# Patient Record
Sex: Female | Born: 1955 | Race: White | Hispanic: No | Marital: Married | Smoking: Never smoker
Health system: Southern US, Community
[De-identification: ages and names within clinical notes are randomized; demographics above are authoritative.]

## PROBLEM LIST (undated history)

## (undated) DIAGNOSIS — K219 Gastro-esophageal reflux disease without esophagitis: Secondary | ICD-10-CM

## (undated) DIAGNOSIS — M1990 Unspecified osteoarthritis, unspecified site: Secondary | ICD-10-CM

## (undated) DIAGNOSIS — E039 Hypothyroidism, unspecified: Secondary | ICD-10-CM

## (undated) DIAGNOSIS — R0602 Shortness of breath: Secondary | ICD-10-CM

## (undated) DIAGNOSIS — E119 Type 2 diabetes mellitus without complications: Secondary | ICD-10-CM

## (undated) DIAGNOSIS — I10 Essential (primary) hypertension: Secondary | ICD-10-CM

## (undated) HISTORY — PX: WISDOM TOOTH EXTRACTION: SHX21

---

## 2005-09-22 ENCOUNTER — Encounter: Admission: RE | Admit: 2005-09-22 | Discharge: 2005-12-21 | Payer: Self-pay | Admitting: Surgery

## 2013-12-09 ENCOUNTER — Other Ambulatory Visit: Payer: Self-pay | Admitting: Orthopaedic Surgery

## 2013-12-21 ENCOUNTER — Other Ambulatory Visit (HOSPITAL_COMMUNITY): Payer: Self-pay | Admitting: *Deleted

## 2013-12-21 ENCOUNTER — Encounter (HOSPITAL_COMMUNITY): Payer: Self-pay

## 2013-12-21 ENCOUNTER — Encounter (HOSPITAL_COMMUNITY)
Admission: RE | Admit: 2013-12-21 | Discharge: 2013-12-21 | Disposition: A | Discharge status: Home / Self Care | Payer: 59 | Source: Ambulatory Visit | Attending: Orthopaedic Surgery | Admitting: Orthopaedic Surgery

## 2013-12-21 ENCOUNTER — Ambulatory Visit (HOSPITAL_COMMUNITY)
Admission: RE | Admit: 2013-12-21 | Discharge: 2013-12-21 | Disposition: A | Discharge status: Home / Self Care | Payer: 59 | Source: Ambulatory Visit | Attending: Orthopaedic Surgery | Admitting: Orthopaedic Surgery

## 2013-12-21 DIAGNOSIS — I10 Essential (primary) hypertension: Secondary | ICD-10-CM | POA: Insufficient documentation

## 2013-12-21 DIAGNOSIS — Z01818 Encounter for other preprocedural examination: Secondary | ICD-10-CM | POA: Insufficient documentation

## 2013-12-21 DIAGNOSIS — E119 Type 2 diabetes mellitus without complications: Secondary | ICD-10-CM | POA: Insufficient documentation

## 2013-12-21 DIAGNOSIS — E039 Hypothyroidism, unspecified: Secondary | ICD-10-CM | POA: Insufficient documentation

## 2013-12-21 DIAGNOSIS — K219 Gastro-esophageal reflux disease without esophagitis: Secondary | ICD-10-CM | POA: Insufficient documentation

## 2013-12-21 HISTORY — DX: Type 2 diabetes mellitus without complications: E11.9

## 2013-12-21 HISTORY — DX: Essential (primary) hypertension: I10

## 2013-12-21 HISTORY — DX: Gastro-esophageal reflux disease without esophagitis: K21.9

## 2013-12-21 HISTORY — DX: Unspecified osteoarthritis, unspecified site: M19.90

## 2013-12-21 HISTORY — DX: Shortness of breath: R06.02

## 2013-12-21 HISTORY — DX: Hypothyroidism, unspecified: E03.9

## 2013-12-21 LAB — CBC WITH DIFFERENTIAL/PLATELET
BASOS ABS: 0.1 10*3/uL (ref 0.0–0.1)
Basophils Relative: 1 % (ref 0–1)
Eosinophils Absolute: 0.2 10*3/uL (ref 0.0–0.7)
Eosinophils Relative: 2 % (ref 0–5)
HEMATOCRIT: 45.3 % (ref 36.0–46.0)
Hemoglobin: 15.5 g/dL — ABNORMAL HIGH (ref 12.0–15.0)
LYMPHS ABS: 4.2 10*3/uL — AB (ref 0.7–4.0)
LYMPHS PCT: 39 % (ref 12–46)
MCH: 30 pg (ref 26.0–34.0)
MCHC: 34.2 g/dL (ref 30.0–36.0)
MCV: 87.8 fL (ref 78.0–100.0)
Monocytes Absolute: 0.7 10*3/uL (ref 0.1–1.0)
Monocytes Relative: 6 % (ref 3–12)
Neutro Abs: 5.6 10*3/uL (ref 1.7–7.7)
Neutrophils Relative %: 52 % (ref 43–77)
PLATELETS: 321 10*3/uL (ref 150–400)
RBC: 5.16 MIL/uL — ABNORMAL HIGH (ref 3.87–5.11)
RDW: 13 % (ref 11.5–15.5)
WBC: 10.7 10*3/uL — AB (ref 4.0–10.5)

## 2013-12-21 LAB — URINALYSIS, ROUTINE W REFLEX MICROSCOPIC
Glucose, UA: NEGATIVE mg/dL
HGB URINE DIPSTICK: NEGATIVE
KETONES UR: NEGATIVE mg/dL
NITRITE: NEGATIVE
PROTEIN: NEGATIVE mg/dL
Specific Gravity, Urine: 1.03 (ref 1.005–1.030)
Urobilinogen, UA: 0.2 mg/dL (ref 0.0–1.0)
pH: 5 (ref 5.0–8.0)

## 2013-12-21 LAB — APTT: APTT: 30 s (ref 24–37)

## 2013-12-21 LAB — URINE MICROSCOPIC-ADD ON

## 2013-12-21 LAB — TYPE AND SCREEN
ABO/RH(D): A NEG
ANTIBODY SCREEN: NEGATIVE

## 2013-12-21 LAB — SURGICAL PCR SCREEN
MRSA, PCR: NEGATIVE
Staphylococcus aureus: NEGATIVE

## 2013-12-21 LAB — BASIC METABOLIC PANEL
BUN: 17 mg/dL (ref 6–23)
CALCIUM: 11.1 mg/dL — AB (ref 8.4–10.5)
CHLORIDE: 100 meq/L (ref 96–112)
CO2: 22 meq/L (ref 19–32)
Creatinine, Ser: 0.62 mg/dL (ref 0.50–1.10)
GFR calc Af Amer: >90 mL/min (ref >90)
GFR calc non Af Amer: >90 mL/min (ref >90)
Glucose, Bld: 140 mg/dL — ABNORMAL HIGH (ref 70–99)
Potassium: 4.5 mEq/L (ref 3.7–5.3)
SODIUM: 140 meq/L (ref 137–147)

## 2013-12-21 LAB — PROTIME-INR
INR: 0.85 (ref 0.00–1.49)
PROTHROMBIN TIME: 11.5 s — AB (ref 11.6–15.2)

## 2013-12-21 LAB — ABO/RH: ABO/RH(D): A NEG

## 2013-12-21 NOTE — Pre-Procedure Instructions (Signed)
Melinda ScheuermannJanet E Holden  12/21/2013   Your procedure is scheduled on:  Thursday, Dec 29, 2013 at 7:15 AM.   [facility] Admitting Office at 5:30 AM.   Call this number if you have problems the morning of surgery: [phone]   Remember:   Do not eat food or drink liquids after midnight Wednesday, 12/28/13.   Take these medicines the morning of surgery with A SIP OF WATER: None   Do not wear jewelry, make-up or nail polish.  Do not wear lotions, powders, or perfumes. You may wear deodorant.  Do not shave 48 hours prior to surgery.   Do not bring valuables to the hospital.  [facility] is not responsible                  for any belongings or valuables.               Contacts, dentures or bridgework may not be worn into surgery.  Leave suitcase in the car. After surgery it may be brought to your room.  For patients admitted to the hospital, discharge time is determined by your                treatment team.             Special Instructions: [place] - Preparing for Surgery  Before surgery, you can play an important role.  Because skin is not sterile, your skin needs to be as free of germs as possible.  You can reduce the number of germs on you skin by washing with CHG (chlorahexidine gluconate) soap before surgery.  CHG is an antiseptic cleaner which kills germs and bonds with the skin to continue killing germs even after washing.  Please DO NOT use if you have an allergy to CHG or antibacterial soaps.  If your skin becomes reddened/irritated stop using the CHG and inform your nurse when you arrive at Short Stay.  Do not shave (including legs and underarms) for at least 48 hours prior to the first CHG shower.  You may shave your face.  Please follow these instructions carefully:   1.  Shower with CHG Soap the night before surgery and the                                morning of Surgery.  2.  If you choose to wash your hair, wash your hair first as usual  with your       normal shampoo.  3.  After you shampoo, rinse your hair and body thoroughly to remove the                      Shampoo.  4.  Use CHG as you would any other liquid soap.  You can apply chg directly       to the skin and wash gently with scrungie or a clean washcloth.  5.  Apply the CHG Soap to your body ONLY FROM THE NECK DOWN.        Do not use on open wounds or open sores.  Avoid contact with your eyes, ears, mouth and genitals (private parts).  Wash genitals (private parts) with your normal soap.  6.  Wash thoroughly, paying special attention to the area where your surgery        will be performed.  7.  Thoroughly rinse your body  with warm water from the neck down.  8.  DO NOT shower/wash with your normal soap after using and rinsing off       the CHG Soap.  9.  Pat yourself dry with a clean towel.            10.  Wear clean pajamas.            11.  Place clean sheets on your bed the night of your first shower and do not        sleep with pets.  Day of Surgery  Do not apply any lotions the morning of surgery.  Please wear clean clothes to the hospital/surgery center.     Please read over the following fact sheets that you were given: Pain Booklet, Coughing and Deep Breathing, Blood Transfusion Information, MRSA Information and Surgical Site Infection Prevention

## 2013-12-22 ENCOUNTER — Encounter (HOSPITAL_COMMUNITY): Payer: Self-pay | Admitting: Vascular Surgery

## 2013-12-22 NOTE — Progress Notes (Signed)
Anesthesia Chart Review:  Patient is a 58 year old female scheduled for right TKA on 12/29/13 by Dr. Jerl Santosalldorf.  History includes nonsmoker, hypertension, diabetes mellitus type 2, GERD, hypothyroidism, arthritis, dyspnea on exertion. BMI is consistent with morbid obesity. PCP is listed as Dr. Talmage NapBalan who is an [facility].  Preoperative CXR and labs noted.  EKG on 12/21/13 showed NSR, LAD, low voltage QRS, possible anterolateral infarct (age undetermined). Currently, there are no comparison EKGs available. Poor r wave progression may be due to lead placement on a morbidly obese female; however, she does have multiple CAD risk factors.  I reviewed above with anesthesiologist Dr. Gelene MinkFrederick. Patient will need medical clearance for this procedure.  Will defer decision for any additional preoperative testing and/or referral to her PCP.    I notified Agustin CreeKathy Blume at Dr. Nolon Nationsalldorf's office that patient's EKG should be forwarded to her PCP and that she will need medical clearance for surgery.  Olegario MessierKathy will discuss with Dr. Jerl Santosalldorf and make arrangements.  Velna Ochsllison Arien Benincasa, PA-C [facility] Short Stay Center/Anesthesiology Phone [phone] 12/22/2013 5:23 PM

## 2013-12-29 ENCOUNTER — Encounter (HOSPITAL_COMMUNITY): Admission: RE | Payer: Self-pay | Source: Ambulatory Visit

## 2013-12-29 ENCOUNTER — Inpatient Hospital Stay (HOSPITAL_COMMUNITY): Admission: RE | Admit: 2013-12-29 | Payer: 59 | Source: Ambulatory Visit | Admitting: Orthopaedic Surgery

## 2013-12-29 SURGERY — ARTHROPLASTY, KNEE, TOTAL
Anesthesia: Choice | Laterality: Right

## 2014-02-02 ENCOUNTER — Other Ambulatory Visit: Payer: Self-pay | Admitting: Orthopaedic Surgery

## 2014-02-16 ENCOUNTER — Encounter (HOSPITAL_COMMUNITY): Payer: Self-pay | Admitting: Pharmacy Technician

## 2014-02-18 NOTE — Pre-Procedure Instructions (Addendum)
Melinda ScheuermannJanet E Holden  02/18/2014   Your procedure is scheduled on:  July 21  [facility] at 05:30 AM.  Call this number if you have problems the morning of surgery: [phone]   Remember:   Do not eat food or drink liquids after midnight.   Take these medicines the morning of surgery with A SIP OF WATER: Pepcid, Levothyroxine, Eye drops   STOP Kril Oil, Multiple Vitamins, Glucosamine- Chondroitin, Vitamin D, Aspirin 02/23/14   STOP/ Do not take Aspirin, Aleve, Naproxen, Advil, Ibuprofen, Motrin, Vitamins, Herbs, or Supplements 02/23/14    NO DIABETIC MED OR INSULIN DAY OF SURGERY   Do not wear jewelry, make-up or nail polish.  Do not wear lotions, powders, or perfumes. You may wear deodorant.  Do not shave 48 hours prior to surgery. Men may shave face and neck.  Do not bring valuables to the hospital.  [facility] is not responsible for any belongings or valuables.               Contacts, dentures or bridgework may not be worn into surgery.  Leave suitcase in the car. After surgery it may be brought to your room.  For patients admitted to the hospital, discharge time is determined by your treatment team.                 Special InstructionS   Special Instructions: [place] - Preparing for Surgery  Before surgery, you can play an important role.  Because skin is not sterile, your skin needs to be as free of germs as possible.  You can reduce the number of germs on you skin by washing with CHG (chlorahexidine gluconate) soap before surgery.  CHG is an antiseptic cleaner which kills germs and bonds with the skin to continue killing germs even after washing.  Please DO NOT use if you have an allergy to CHG or antibacterial soaps.  If your skin becomes reddened/irritated stop using the CHG and inform your nurse when you arrive at Short Stay.  Do not shave (including legs and underarms) for at least 48 hours prior to the first CHG shower.  You may shave your  face.  Please follow these instructions carefully:   1.  Shower with CHG Soap the night before surgery and the morning of Surgery.  2.  If you choose to wash your hair, wash your hair first as usual with your normal shampoo.  3.  After you shampoo, rinse your hair and body thoroughly to remove the Shampoo.  4.  Use CHG as you would any other liquid soap.  You can apply chg directly  to the skin and wash gently with scrungie or a clean washcloth.  5.  Apply the CHG Soap to your body ONLY FROM THE NECK DOWN.  Do not use on open wounds or open sores.  Avoid contact with your eyes ears, mouth and genitals (private parts).  Wash genitals (private parts)       with your normal soap.  6.  Wash thoroughly, paying special attention to the area where your surgery will be performed.  7.  Thoroughly rinse your body with warm water from the neck down.  8.  DO NOT shower/wash with your normal soap after using and rinsing off the CHG Soap.  9.  Pat yourself dry with a clean towel.            10.  Wear clean pajamas.  11.  Place clean sheets on your bed the night of your first shower and do not sleep with pets.  Day of Surgery  Do not apply any lotions/deodorants the morning of surgery.  Please wear clean clothes to the hospital/surgery center.   Please read over the following fact sheets that you were given: Pain Booklet, Coughing and Deep Breathing, Blood Transfusion Information, Total Joint Packet and Surgical Site Infection Prevention

## 2014-02-20 ENCOUNTER — Encounter (HOSPITAL_COMMUNITY)
Admission: RE | Admit: 2014-02-20 | Discharge: 2014-02-20 | Disposition: A | Discharge status: Home / Self Care | Payer: 59 | Source: Ambulatory Visit | Attending: Orthopaedic Surgery | Admitting: Orthopaedic Surgery

## 2014-02-20 ENCOUNTER — Encounter (HOSPITAL_COMMUNITY): Payer: Self-pay

## 2014-02-20 DIAGNOSIS — Z01812 Encounter for preprocedural laboratory examination: Secondary | ICD-10-CM | POA: Insufficient documentation

## 2014-02-20 DIAGNOSIS — Z01818 Encounter for other preprocedural examination: Secondary | ICD-10-CM | POA: Insufficient documentation

## 2014-02-20 LAB — PROTIME-INR
INR: 0.92 (ref 0.00–1.49)
PROTHROMBIN TIME: 12.4 s (ref 11.6–15.2)

## 2014-02-20 LAB — URINALYSIS, ROUTINE W REFLEX MICROSCOPIC
Glucose, UA: NEGATIVE mg/dL
Hgb urine dipstick: NEGATIVE
Ketones, ur: 15 mg/dL — AB
NITRITE: NEGATIVE
PH: 5 (ref 5.0–8.0)
Protein, ur: NEGATIVE mg/dL
Specific Gravity, Urine: 1.029 (ref 1.005–1.030)
Urobilinogen, UA: 0.2 mg/dL (ref 0.0–1.0)

## 2014-02-20 LAB — URINE MICROSCOPIC-ADD ON

## 2014-02-20 LAB — TYPE AND SCREEN
ABO/RH(D): A NEG
Antibody Screen: NEGATIVE

## 2014-02-20 LAB — BASIC METABOLIC PANEL
Anion gap: 17 — ABNORMAL HIGH (ref 5–15)
BUN: 15 mg/dL (ref 6–23)
CO2: 23 mEq/L (ref 19–32)
Calcium: 10.3 mg/dL (ref 8.4–10.5)
Chloride: 102 mEq/L (ref 96–112)
Creatinine, Ser: 0.64 mg/dL (ref 0.50–1.10)
GFR calc Af Amer: >90 mL/min (ref >90)
GLUCOSE: 81 mg/dL (ref 70–99)
Potassium: 4.2 mEq/L (ref 3.7–5.3)
Sodium: 142 mEq/L (ref 137–147)

## 2014-02-20 LAB — APTT: aPTT: 29 seconds (ref 24–37)

## 2014-02-20 LAB — CBC WITH DIFFERENTIAL/PLATELET
BASOS ABS: 0.1 10*3/uL (ref 0.0–0.1)
Basophils Relative: 1 % (ref 0–1)
EOS ABS: 0.3 10*3/uL (ref 0.0–0.7)
Eosinophils Relative: 3 % (ref 0–5)
HCT: 42.7 % (ref 36.0–46.0)
Hemoglobin: 14.5 g/dL (ref 12.0–15.0)
LYMPHS ABS: 5 10*3/uL — AB (ref 0.7–4.0)
Lymphocytes Relative: 45 % (ref 12–46)
MCH: 29.6 pg (ref 26.0–34.0)
MCHC: 34 g/dL (ref 30.0–36.0)
MCV: 87.1 fL (ref 78.0–100.0)
Monocytes Absolute: 0.8 10*3/uL (ref 0.1–1.0)
Monocytes Relative: 8 % (ref 3–12)
Neutro Abs: 4.6 10*3/uL (ref 1.7–7.7)
Neutrophils Relative %: 43 % (ref 43–77)
PLATELETS: 268 10*3/uL (ref 150–400)
RBC: 4.9 MIL/uL (ref 3.87–5.11)
RDW: 13.1 % (ref 11.5–15.5)
WBC: 10.7 10*3/uL — ABNORMAL HIGH (ref 4.0–10.5)

## 2014-02-20 LAB — SURGICAL PCR SCREEN
MRSA, PCR: NEGATIVE
STAPHYLOCOCCUS AUREUS: NEGATIVE

## 2014-02-20 NOTE — Progress Notes (Signed)
REQ'D OFFICE NOTE ,EKG, SURGICAL CLEARANCE NOTE FROM DR Malachi CarlBRIAN MACKINZIE /[facility]

## 2014-02-21 NOTE — Progress Notes (Signed)
Anesthesia follow-up:  See my note from 12/22/13.  Medical clearance was recommended prior to proceeding with right TKA. She was seen by Dr. Shary DecampBrian McKenzie who ultimately felt she was "at low to moderate risk for complications from this surgery due to her diabetes being a moderate risk factor, but the EKG does not change this. No indication exists for further pre-op evaluation."  Surgery is now scheduled for 02/28/14.   Her EKG on 01/12/14 ([facility]; already reviewed by Dr. Ronne BinningMcKenzie as part of her medical clearance) showed: Probable SR (vs ectopic atrial rhythm), LAD, poor r wave progression, low QRS voltages in precordial leads.    Preoperative labs noted. UA showed large leukocytes, negative nitrites.  WBC 10.7.  I routed UA result to Dr. Jerl Santosalldorf, and I also left a voice message with Agustin CreeKathy Blume at his office.  Defer treatment recommendations, if any, to surgeon.  She is now medically cleared, so if no acute changes then I anticipate that she can proceed as planned from an anesthesia standpoint.  Velna Ochsllison Leocadia Idleman, PA-C [facility] Short Stay Center/Anesthesiology Phone [phone] 02/21/2014 3:52 PM

## 2014-02-22 NOTE — H&P (Signed)
TOTAL KNEE ADMISSION H&P  Patient is being admitted for right total knee arthroplasty.  Subjective:  Chief Complaint:right knee pain.  HPI: Melinda Holden, 58 y.o. female, has a history of pain and functional disability in the right knee due to arthritis and has failed non-surgical conservative treatments for greater than 12 weeks to includeNSAID's and/or analgesics, corticosteriod injections, flexibility and strengthening excercises, use of assistive devices, weight reduction as appropriate and activity modification.  Onset of symptoms was gradual, starting 5 years ago with gradually worsening course since that time. The patient noted no past surgery on the right knee(s).  Patient currently rates pain in the right knee(s) at 10 out of 10 with activity. Patient has night pain, worsening of pain with activity and weight bearing, pain that interferes with activities of daily living, pain with passive range of motion and crepitus.  Patient has evidence of subchondral sclerosis, periarticular osteophytes and joint space narrowing by imaging studies. This patient has had no. There is no active infection.  There are no active problems to display for this patient.  Past Medical History  Diagnosis Date  . Hypertension   . Shortness of breath     exertion  . Hypothyroidism   . Diabetes mellitus without complication   . GERD (gastroesophageal reflux disease)   . Arthritis     Past Surgical History  Procedure Laterality Date  . Cesarean section  1995  . Wisdom tooth extraction      No prescriptions prior to admission   Allergies  Allergen Reactions  . Benzalkonium Rash  . Chloramphenicols Rash  . Penicillins Rash    History  Substance Use Topics  . Smoking status: Never Smoker   . Smokeless tobacco: Never Used  . Alcohol Use: No    No family history on file.   Review of Systems  Constitutional: Negative.   HENT: Negative.   Eyes: Negative.   Respiratory: Negative.    Cardiovascular: Negative.   Gastrointestinal: Negative.   Genitourinary: Negative.   Musculoskeletal: Positive for joint pain.  Skin: Negative.   Neurological: Negative.   Endo/Heme/Allergies: Negative.   Psychiatric/Behavioral: Negative.     Objective:  Physical Exam  Constitutional: She is oriented to person, place, and time. She appears well-developed.  HENT:  Head: Normocephalic.  Eyes: Pupils are equal, round, and reactive to light.  Neck: Normal range of motion.  Cardiovascular: Normal rate.   Respiratory: Effort normal.  GI: Soft.  Musculoskeletal:  Right knee exam: Range of motion 0-90.  Severe pain with flexion.  Pain medial joint line.  Crepitation positive severe with motion there as well.  Neurological: She is oriented to person, place, and time.  Skin: Skin is warm.  Psychiatric: She has a normal mood and affect.    Vital signs in last 24 hours:    Labs:   Estimated body mass index is 48.44 kg/(m^2) as calculated from the following:   Height as of 12/21/13: 5' (1.524 m).   Weight as of 12/21/13: 112.5 kg (248 lb 0.3 oz).   Imaging Review Plain radiographs demonstrate severe degenerative joint disease of the right knee(s). The overall alignment isneutral. The bone quality appears to be good for age and reported activity level.  Assessment/Plan:  End stage arthritis, right knee   The patient history, physical examination, clinical judgment of the provider and imaging studies are consistent with end stage degenerative joint disease of the right knee(s) and total knee arthroplasty is deemed medically necessary. The treatment options including medical  management, injection therapy arthroscopy and arthroplasty were discussed at length. The risks and benefits of total knee arthroplasty were presented and reviewed. The risks due to aseptic loosening, infection, stiffness, patella tracking problems, thromboembolic complications and other imponderables were discussed.  The patient acknowledged the explanation, agreed to proceed with the plan and consent was signed. Patient is being admitted for inpatient treatment for surgery, pain control, PT, OT, prophylactic antibiotics, VTE prophylaxis, progressive ambulation and ADL's and discharge planning. The patient is planning to be discharged home with home health services

## 2014-02-27 MED ORDER — LACTATED RINGERS IV SOLN: INTRAVENOUS | Status: DC

## 2014-02-27 MED ORDER — VANCOMYCIN HCL 10 G IV SOLR
1500.0000 mg | INTRAVENOUS | Status: AC
  Administered 2014-02-28: 1500 mg via INTRAVENOUS
  Filled 2014-02-27: qty 1500

## 2014-02-27 MED ORDER — CHLORHEXIDINE GLUCONATE 4 % EX LIQD
60.0000 mL | Freq: Once | CUTANEOUS | Status: DC
  Filled 2014-02-27: qty 60

## 2014-02-28 ENCOUNTER — Encounter (HOSPITAL_COMMUNITY): Payer: 59 | Admitting: Vascular Surgery

## 2014-02-28 ENCOUNTER — Encounter (HOSPITAL_COMMUNITY): Payer: Self-pay | Admitting: *Deleted

## 2014-02-28 ENCOUNTER — Inpatient Hospital Stay (HOSPITAL_COMMUNITY): Payer: 59 | Admitting: Anesthesiology

## 2014-02-28 ENCOUNTER — Inpatient Hospital Stay (HOSPITAL_COMMUNITY)
Admission: RE | Admit: 2014-02-28 | Discharge: 2014-03-01 | DRG: 470 | Disposition: A | Discharge status: Home Health | Payer: 59 | Source: Ambulatory Visit | Attending: Orthopaedic Surgery | Admitting: Orthopaedic Surgery

## 2014-02-28 ENCOUNTER — Encounter (HOSPITAL_COMMUNITY)
Admission: RE | Disposition: A | Discharge status: Home Health | Payer: Self-pay | Source: Ambulatory Visit | Attending: Orthopaedic Surgery

## 2014-02-28 DIAGNOSIS — Z6841 Body Mass Index (BMI) 40.0 and over, adult: Secondary | ICD-10-CM

## 2014-02-28 DIAGNOSIS — Z7982 Long term (current) use of aspirin: Secondary | ICD-10-CM

## 2014-02-28 DIAGNOSIS — Z880 Allergy status to penicillin: Secondary | ICD-10-CM

## 2014-02-28 DIAGNOSIS — M1711 Unilateral primary osteoarthritis, right knee: Secondary | ICD-10-CM | POA: Diagnosis present

## 2014-02-28 DIAGNOSIS — I10 Essential (primary) hypertension: Secondary | ICD-10-CM | POA: Diagnosis present

## 2014-02-28 DIAGNOSIS — K219 Gastro-esophageal reflux disease without esophagitis: Secondary | ICD-10-CM | POA: Diagnosis present

## 2014-02-28 DIAGNOSIS — Z888 Allergy status to other drugs, medicaments and biological substances status: Secondary | ICD-10-CM

## 2014-02-28 DIAGNOSIS — E039 Hypothyroidism, unspecified: Secondary | ICD-10-CM | POA: Diagnosis present

## 2014-02-28 DIAGNOSIS — Z794 Long term (current) use of insulin: Secondary | ICD-10-CM

## 2014-02-28 DIAGNOSIS — E119 Type 2 diabetes mellitus without complications: Secondary | ICD-10-CM | POA: Diagnosis present

## 2014-02-28 DIAGNOSIS — M1710 Unilateral primary osteoarthritis, unspecified knee: Principal | ICD-10-CM | POA: Diagnosis present

## 2014-02-28 DIAGNOSIS — Z79899 Other long term (current) drug therapy: Secondary | ICD-10-CM

## 2014-02-28 HISTORY — PX: TOTAL KNEE ARTHROPLASTY: SHX125

## 2014-02-28 LAB — GLUCOSE, CAPILLARY
Glucose-Capillary: 132 mg/dL — ABNORMAL HIGH (ref 70–99)
Glucose-Capillary: 102 mg/dL — ABNORMAL HIGH (ref 70–99)
Glucose-Capillary: 157 mg/dL — ABNORMAL HIGH (ref 70–99)
Glucose-Capillary: 304 mg/dL — ABNORMAL HIGH (ref 70–99)

## 2014-02-28 SURGERY — ARTHROPLASTY, KNEE, TOTAL
Anesthesia: Monitor Anesthesia Care | Site: Knee | Laterality: Right

## 2014-02-28 MED ORDER — VANCOMYCIN HCL IN DEXTROSE 1-5 GM/200ML-% IV SOLN
1000.0000 mg | Freq: Two times a day (BID) | INTRAVENOUS | Status: AC
  Administered 2014-02-28: 1000 mg via INTRAVENOUS
  Filled 2014-02-28: qty 200

## 2014-02-28 MED ORDER — BUPIVACAINE IN DEXTROSE 0.75-8.25 % IT SOLN
INTRATHECAL | Status: DC | PRN
  Administered 2014-02-28: 12 mg via INTRATHECAL

## 2014-02-28 MED ORDER — LACTATED RINGERS IV SOLN
INTRAVENOUS | Status: DC | PRN
  Administered 2014-02-28 (×2): via INTRAVENOUS

## 2014-02-28 MED ORDER — PROPOFOL 10 MG/ML IV BOLUS
INTRAVENOUS | Status: AC
  Filled 2014-02-28: qty 20

## 2014-02-28 MED ORDER — LACTATED RINGERS IV SOLN: INTRAVENOUS | Status: DC

## 2014-02-28 MED ORDER — OXYCODONE HCL 5 MG PO TABS: 5.0000 mg | ORAL_TABLET | Freq: Once | ORAL | Status: DC | PRN

## 2014-02-28 MED ORDER — METFORMIN HCL ER 500 MG PO TB24
1000.0000 mg | ORAL_TABLET | Freq: Two times a day (BID) | ORAL | Status: DC
  Administered 2014-02-28 – 2014-03-01 (×2): 1000 mg via ORAL
  Filled 2014-02-28 (×6): qty 2

## 2014-02-28 MED ORDER — ONDANSETRON HCL 4 MG/2ML IJ SOLN: 4.0000 mg | Freq: Four times a day (QID) | INTRAMUSCULAR | Status: DC | PRN

## 2014-02-28 MED ORDER — FENTANYL CITRATE 0.05 MG/ML IJ SOLN
INTRAMUSCULAR | Status: AC
  Filled 2014-02-28: qty 5

## 2014-02-28 MED ORDER — INSULIN LISPRO 100 UNIT/ML ~~LOC~~ SOLN: 22.0000 [IU] | Freq: Two times a day (BID) | SUBCUTANEOUS | Status: DC

## 2014-02-28 MED ORDER — BISACODYL 5 MG PO TBEC: 5.0000 mg | DELAYED_RELEASE_TABLET | Freq: Every day | ORAL | Status: DC | PRN

## 2014-02-28 MED ORDER — HYDROMORPHONE HCL PF 1 MG/ML IJ SOLN: 0.5000 mg | INTRAMUSCULAR | Status: DC | PRN

## 2014-02-28 MED ORDER — FAMOTIDINE 20 MG PO TABS
20.0000 mg | ORAL_TABLET | Freq: Every day | ORAL | Status: DC | PRN
  Filled 2014-02-28: qty 1

## 2014-02-28 MED ORDER — TIMOLOL MALEATE (ONCE-DAILY) 0.5 % OP SOLN: 1.0000 [drp] | Freq: Every day | OPHTHALMIC | Status: DC

## 2014-02-28 MED ORDER — VITAMIN D 50 MCG (2000 UT) PO TABS: 2000.0000 [IU] | ORAL_TABLET | Freq: Every day | ORAL | Status: DC

## 2014-02-28 MED ORDER — CALCIUM CARBONATE ANTACID 750 MG PO CHEW: 1.0000 | CHEWABLE_TABLET | Freq: Every day | ORAL | Status: DC | PRN

## 2014-02-28 MED ORDER — MENTHOL 3 MG MT LOZG: 1.0000 | LOZENGE | OROMUCOSAL | Status: DC | PRN

## 2014-02-28 MED ORDER — ALUM & MAG HYDROXIDE-SIMETH 200-200-20 MG/5ML PO SUSP: 30.0000 mL | ORAL | Status: DC | PRN

## 2014-02-28 MED ORDER — FENTANYL CITRATE 0.05 MG/ML IJ SOLN
INTRAMUSCULAR | Status: DC | PRN
  Administered 2014-02-28 (×3): 50 ug via INTRAVENOUS
  Administered 2014-02-28: 100 ug via INTRAVENOUS

## 2014-02-28 MED ORDER — BUPIVACAINE LIPOSOME 1.3 % IJ SUSP
INTRAMUSCULAR | Status: DC | PRN
  Administered 2014-02-28: 20 mL

## 2014-02-28 MED ORDER — ATORVASTATIN CALCIUM 80 MG PO TABS
80.0000 mg | ORAL_TABLET | Freq: Every day | ORAL | Status: DC
  Administered 2014-02-28: 80 mg via ORAL
  Filled 2014-02-28 (×3): qty 1

## 2014-02-28 MED ORDER — MIDAZOLAM HCL 5 MG/5ML IJ SOLN
INTRAMUSCULAR | Status: DC | PRN
  Administered 2014-02-28: 2 mg via INTRAVENOUS

## 2014-02-28 MED ORDER — ONDANSETRON HCL 4 MG/2ML IJ SOLN
INTRAMUSCULAR | Status: AC
  Filled 2014-02-28: qty 2

## 2014-02-28 MED ORDER — BUPIVACAINE LIPOSOME 1.3 % IJ SUSP
20.0000 mL | Freq: Once | INTRAMUSCULAR | Status: DC
  Filled 2014-02-28: qty 20

## 2014-02-28 MED ORDER — HYDROMORPHONE HCL PF 1 MG/ML IJ SOLN
INTRAMUSCULAR | Status: AC
  Filled 2014-02-28: qty 1

## 2014-02-28 MED ORDER — METHOCARBAMOL 500 MG PO TABS
500.0000 mg | ORAL_TABLET | Freq: Four times a day (QID) | ORAL | Status: DC | PRN
  Administered 2014-03-01 (×2): 500 mg via ORAL
  Filled 2014-02-28 (×3): qty 1

## 2014-02-28 MED ORDER — HYDROMORPHONE HCL PF 1 MG/ML IJ SOLN
0.2500 mg | INTRAMUSCULAR | Status: DC | PRN
  Administered 2014-02-28 (×3): 0.5 mg via INTRAVENOUS

## 2014-02-28 MED ORDER — SODIUM CHLORIDE 0.9 % IV SOLN
INTRAVENOUS | Status: DC | PRN
  Administered 2014-02-28: 08:00:00 via INTRAVENOUS

## 2014-02-28 MED ORDER — OXYCODONE HCL 5 MG/5ML PO SOLN: 5.0000 mg | Freq: Once | ORAL | Status: DC | PRN

## 2014-02-28 MED ORDER — ROCURONIUM BROMIDE 50 MG/5ML IV SOLN
INTRAVENOUS | Status: AC
  Filled 2014-02-28: qty 1

## 2014-02-28 MED ORDER — ONDANSETRON HCL 4 MG/2ML IJ SOLN
INTRAMUSCULAR | Status: DC | PRN
  Administered 2014-02-28: 4 mg via INTRAVENOUS

## 2014-02-28 MED ORDER — MIDAZOLAM HCL 2 MG/2ML IJ SOLN
INTRAMUSCULAR | Status: AC
  Filled 2014-02-28: qty 2

## 2014-02-28 MED ORDER — PROPOFOL INFUSION 10 MG/ML OPTIME
INTRAVENOUS | Status: DC | PRN
  Administered 2014-02-28: 75 ug/kg/min via INTRAVENOUS

## 2014-02-28 MED ORDER — DOCUSATE SODIUM 100 MG PO CAPS
100.0000 mg | ORAL_CAPSULE | Freq: Two times a day (BID) | ORAL | Status: DC
  Administered 2014-02-28 – 2014-03-01 (×3): 100 mg via ORAL
  Filled 2014-02-28 (×2): qty 1

## 2014-02-28 MED ORDER — ARTIFICIAL TEARS OP OINT
TOPICAL_OINTMENT | OPHTHALMIC | Status: AC
  Filled 2014-02-28: qty 3.5

## 2014-02-28 MED ORDER — METOCLOPRAMIDE HCL 10 MG PO TABS: 5.0000 mg | ORAL_TABLET | Freq: Three times a day (TID) | ORAL | Status: DC | PRN

## 2014-02-28 MED ORDER — EPHEDRINE SULFATE 50 MG/ML IJ SOLN
INTRAMUSCULAR | Status: AC
  Filled 2014-02-28: qty 1

## 2014-02-28 MED ORDER — CALCIUM CARBONATE ANTACID 500 MG PO CHEW
1.0000 | CHEWABLE_TABLET | Freq: Every day | ORAL | Status: DC | PRN
  Filled 2014-02-28: qty 1

## 2014-02-28 MED ORDER — INSULIN DETEMIR 100 UNIT/ML ~~LOC~~ SOLN
75.0000 [IU] | Freq: Two times a day (BID) | SUBCUTANEOUS | Status: DC
  Filled 2014-02-28 (×2): qty 0.75

## 2014-02-28 MED ORDER — INSULIN ASPART 100 UNIT/ML ~~LOC~~ SOLN
0.0000 [IU] | Freq: Three times a day (TID) | SUBCUTANEOUS | Status: DC
  Administered 2014-02-28: 15 [IU] via SUBCUTANEOUS
  Administered 2014-03-01: 11 [IU] via SUBCUTANEOUS
  Administered 2014-03-01: 4 [IU] via SUBCUTANEOUS

## 2014-02-28 MED ORDER — LISINOPRIL 20 MG PO TABS
20.0000 mg | ORAL_TABLET | Freq: Every day | ORAL | Status: DC
  Administered 2014-02-28 – 2014-03-01 (×2): 20 mg via ORAL
  Filled 2014-02-28 (×3): qty 1

## 2014-02-28 MED ORDER — INSULIN ASPART 100 UNIT/ML ~~LOC~~ SOLN: 22.0000 [IU] | Freq: Two times a day (BID) | SUBCUTANEOUS | Status: DC

## 2014-02-28 MED ORDER — VITAMIN D3 25 MCG (1000 UNIT) PO TABS
2000.0000 [IU] | ORAL_TABLET | Freq: Every day | ORAL | Status: DC
  Administered 2014-02-28 – 2014-03-01 (×2): 2000 [IU] via ORAL
  Filled 2014-02-28 (×3): qty 2

## 2014-02-28 MED ORDER — LIDOCAINE HCL (CARDIAC) 20 MG/ML IV SOLN
INTRAVENOUS | Status: DC | PRN
  Administered 2014-02-28: 50 mg via INTRAVENOUS

## 2014-02-28 MED ORDER — HYDROMORPHONE HCL PF 1 MG/ML IJ SOLN
INTRAMUSCULAR | Status: DC | PRN
  Administered 2014-02-28: 0.5 mg via INTRAVENOUS
  Administered 2014-02-28: .5 mg via INTRAVENOUS

## 2014-02-28 MED ORDER — ACETAMINOPHEN 650 MG RE SUPP: 650.0000 mg | Freq: Four times a day (QID) | RECTAL | Status: DC | PRN

## 2014-02-28 MED ORDER — TIMOLOL MALEATE 0.5 % OP SOLN
1.0000 [drp] | Freq: Every day | OPHTHALMIC | Status: DC
  Administered 2014-03-01: 1 [drp] via OPHTHALMIC
  Filled 2014-02-28: qty 5

## 2014-02-28 MED ORDER — TRANEXAMIC ACID 100 MG/ML IV SOLN
1000.0000 mg | INTRAVENOUS | Status: AC
  Administered 2014-02-28: 1000 mg via INTRAVENOUS
  Filled 2014-02-28: qty 10

## 2014-02-28 MED ORDER — ONDANSETRON HCL 4 MG PO TABS: 4.0000 mg | ORAL_TABLET | Freq: Four times a day (QID) | ORAL | Status: DC | PRN

## 2014-02-28 MED ORDER — ASPIRIN EC 325 MG PO TBEC
325.0000 mg | DELAYED_RELEASE_TABLET | Freq: Two times a day (BID) | ORAL | Status: DC
  Administered 2014-03-01: 325 mg via ORAL
  Filled 2014-02-28 (×3): qty 1

## 2014-02-28 MED ORDER — PROMETHAZINE HCL 25 MG/ML IJ SOLN: 6.2500 mg | INTRAMUSCULAR | Status: DC | PRN

## 2014-02-28 MED ORDER — PHENOL 1.4 % MT LIQD: 1.0000 | OROMUCOSAL | Status: DC | PRN

## 2014-02-28 MED ORDER — METOCLOPRAMIDE HCL 5 MG/ML IJ SOLN: 5.0000 mg | Freq: Three times a day (TID) | INTRAMUSCULAR | Status: DC | PRN

## 2014-02-28 MED ORDER — SODIUM CHLORIDE 0.9 % IR SOLN
Status: DC | PRN
  Administered 2014-02-28 (×2): 1000 mL

## 2014-02-28 MED ORDER — SODIUM CHLORIDE 0.9 % IJ SOLN
INTRAMUSCULAR | Status: AC
  Filled 2014-02-28: qty 10

## 2014-02-28 MED ORDER — LIDOCAINE HCL (CARDIAC) 20 MG/ML IV SOLN
INTRAVENOUS | Status: AC
  Filled 2014-02-28: qty 5

## 2014-02-28 MED ORDER — DIPHENHYDRAMINE HCL 12.5 MG/5ML PO ELIX: 12.5000 mg | ORAL_SOLUTION | ORAL | Status: DC | PRN

## 2014-02-28 MED ORDER — FERROUS SULFATE 325 (65 FE) MG PO TABS
325.0000 mg | ORAL_TABLET | Freq: Two times a day (BID) | ORAL | Status: DC
  Administered 2014-03-01: 325 mg via ORAL
  Filled 2014-02-28 (×5): qty 1

## 2014-02-28 MED ORDER — METHOCARBAMOL 1000 MG/10ML IJ SOLN
500.0000 mg | Freq: Four times a day (QID) | INTRAVENOUS | Status: DC | PRN
  Filled 2014-02-28: qty 5

## 2014-02-28 MED ORDER — INSULIN DETEMIR 100 UNIT/ML ~~LOC~~ SOLN
75.0000 [IU] | Freq: Two times a day (BID) | SUBCUTANEOUS | Status: DC
  Administered 2014-02-28 – 2014-03-01 (×2): 75 [IU] via SUBCUTANEOUS
  Filled 2014-02-28 (×3): qty 0.75

## 2014-02-28 MED ORDER — ACETAMINOPHEN 325 MG PO TABS: 650.0000 mg | ORAL_TABLET | Freq: Four times a day (QID) | ORAL | Status: DC | PRN

## 2014-02-28 MED ORDER — SUCCINYLCHOLINE CHLORIDE 20 MG/ML IJ SOLN
INTRAMUSCULAR | Status: AC
  Filled 2014-02-28: qty 1

## 2014-02-28 MED ORDER — LEVOTHYROXINE SODIUM 75 MCG PO TABS
75.0000 ug | ORAL_TABLET | Freq: Every day | ORAL | Status: DC
  Administered 2014-03-01: 75 ug via ORAL
  Filled 2014-02-28 (×3): qty 1

## 2014-02-28 MED ORDER — HYDROCODONE-ACETAMINOPHEN 5-325 MG PO TABS
1.0000 | ORAL_TABLET | ORAL | Status: DC | PRN
  Administered 2014-02-28 – 2014-03-01 (×6): 2 via ORAL
  Filled 2014-02-28 (×6): qty 2

## 2014-02-28 SURGICAL SUPPLY — 61 items
BANDAGE ELASTIC 4 VELCRO ST LF (GAUZE/BANDAGES/DRESSINGS) ×3 IMPLANT
BANDAGE ELASTIC 6 VELCRO ST LF (GAUZE/BANDAGES/DRESSINGS) ×3 IMPLANT
BANDAGE ESMARK 6X9 LF (GAUZE/BANDAGES/DRESSINGS) ×1 IMPLANT
BANDAGE GAUZE ELAST BULKY 4 IN (GAUZE/BANDAGES/DRESSINGS) ×6 IMPLANT
BLADE SAGITTAL 25.0X1.19X90 (BLADE) ×2 IMPLANT
BLADE SAGITTAL 25.0X1.19X90MM (BLADE) ×1
BLADE SURG ROTATE 9660 (MISCELLANEOUS) IMPLANT
BNDG ELASTIC 6X10 VLCR STRL LF (GAUZE/BANDAGES/DRESSINGS) ×3 IMPLANT
BNDG ESMARK 6X9 LF (GAUZE/BANDAGES/DRESSINGS) ×3
BNDG GAUZE ELAST 4 BULKY (GAUZE/BANDAGES/DRESSINGS) ×6 IMPLANT
BOWL SMART MIX CTS (DISPOSABLE) ×3 IMPLANT
CAPT RP KNEE ×3 IMPLANT
CEMENT HV SMART SET (Cement) ×6 IMPLANT
COVER SURGICAL LIGHT HANDLE (MISCELLANEOUS) ×3 IMPLANT
CUFF TOURNIQUET SINGLE 34IN LL (TOURNIQUET CUFF) ×3 IMPLANT
CUFF TOURNIQUET SINGLE 44IN (TOURNIQUET CUFF) IMPLANT
DRAPE EXTREMITY T 121X128X90 (DRAPE) ×3 IMPLANT
DRAPE PROXIMA HALF (DRAPES) ×3 IMPLANT
DRAPE U-SHAPE 47X51 STRL (DRAPES) ×3 IMPLANT
DRSG ADAPTIC 3X8 NADH LF (GAUZE/BANDAGES/DRESSINGS) ×3 IMPLANT
DRSG PAD ABDOMINAL 8X10 ST (GAUZE/BANDAGES/DRESSINGS) ×3 IMPLANT
DURAPREP 26ML APPLICATOR (WOUND CARE) ×6 IMPLANT
ELECT REM PT RETURN 9FT ADLT (ELECTROSURGICAL) ×3
ELECTRODE REM PT RTRN 9FT ADLT (ELECTROSURGICAL) ×1 IMPLANT
GLOVE BIO SURGEON STRL SZ8 (GLOVE) ×6 IMPLANT
GLOVE BIOGEL PI IND STRL 8 (GLOVE) ×2 IMPLANT
GLOVE BIOGEL PI INDICATOR 8 (GLOVE) ×4
GOWN STRL REUS W/ TWL LRG LVL3 (GOWN DISPOSABLE) ×1 IMPLANT
GOWN STRL REUS W/ TWL XL LVL3 (GOWN DISPOSABLE) ×1 IMPLANT
GOWN STRL REUS W/TWL 2XL LVL3 (GOWN DISPOSABLE) ×3 IMPLANT
GOWN STRL REUS W/TWL LRG LVL3 (GOWN DISPOSABLE) ×2
GOWN STRL REUS W/TWL XL LVL3 (GOWN DISPOSABLE) ×2
HANDPIECE INTERPULSE COAX TIP (DISPOSABLE) ×2
HOOD PEEL AWAY FACE SHEILD DIS (HOOD) ×6 IMPLANT
IMMOBILIZER KNEE 20 (SOFTGOODS) IMPLANT
IMMOBILIZER KNEE 22 UNIV (SOFTGOODS) ×3 IMPLANT
IMMOBILIZER KNEE 24 THIGH 36 (MISCELLANEOUS) IMPLANT
IMMOBILIZER KNEE 24 UNIV (MISCELLANEOUS)
KIT BASIN OR (CUSTOM PROCEDURE TRAY) ×3 IMPLANT
KIT ROOM TURNOVER OR (KITS) ×3 IMPLANT
MANIFOLD NEPTUNE II (INSTRUMENTS) ×3 IMPLANT
NEEDLE HYPO 21X1 ECLIPSE (NEEDLE) ×3 IMPLANT
NS IRRIG 1000ML POUR BTL (IV SOLUTION) ×3 IMPLANT
PACK TOTAL JOINT (CUSTOM PROCEDURE TRAY) ×3 IMPLANT
PAD ARMBOARD 7.5X6 YLW CONV (MISCELLANEOUS) ×6 IMPLANT
SET HNDPC FAN SPRY TIP SCT (DISPOSABLE) ×1 IMPLANT
SPONGE GAUZE 4X4 12PLY (GAUZE/BANDAGES/DRESSINGS) ×3 IMPLANT
SPONGE GAUZE 4X4 12PLY STER LF (GAUZE/BANDAGES/DRESSINGS) ×3 IMPLANT
STAPLER VISISTAT 35W (STAPLE) IMPLANT
SUCTION FRAZIER TIP 10 FR DISP (SUCTIONS) IMPLANT
SUT MNCRL AB 3-0 PS2 18 (SUTURE) IMPLANT
SUT VIC AB 0 CT1 27 (SUTURE) ×4
SUT VIC AB 0 CT1 27XBRD ANBCTR (SUTURE) ×2 IMPLANT
SUT VIC AB 2-0 CT1 27 (SUTURE) ×4
SUT VIC AB 2-0 CT1 TAPERPNT 27 (SUTURE) ×2 IMPLANT
SUT VLOC 180 0 24IN GS25 (SUTURE) ×3 IMPLANT
SYR 50ML LL SCALE MARK (SYRINGE) ×3 IMPLANT
TOWEL OR 17X24 6PK STRL BLUE (TOWEL DISPOSABLE) ×3 IMPLANT
TOWEL OR 17X26 10 PK STRL BLUE (TOWEL DISPOSABLE) ×3 IMPLANT
TRAY FOLEY CATH 14FR (SET/KITS/TRAYS/PACK) IMPLANT
WATER STERILE IRR 1000ML POUR (IV SOLUTION) ×6 IMPLANT

## 2014-02-28 NOTE — Progress Notes (Signed)
Orthopedic Tech Progress Note Patient Details:  Melinda Holden Feb 14, 1956 [id number]  CPM Right Knee CPM Right Knee: On Right Knee Flexion (Degrees): 60 Right Knee Extension (Degrees): 0 Additional Comments: foot roll   Cammer, Mickie BailJennifer Carol 02/28/2014, 10:33 AM

## 2014-02-28 NOTE — Progress Notes (Signed)
Orthopedic Tech Progress Note Patient Details:  Garnette ScheuermannJanet E Sonntag 1956-01-28 [id number] On cpm at 8:10 pm RLE 0-55 Patient ID: Garnette ScheuermannJanet E Sek, female   DOB: 1956-01-28, 58 y.o.   MRN: [id number]   [name], Nyshawn Gowdy Craig 02/28/2014, 8:10 PM

## 2014-02-28 NOTE — Interval H&P Note (Signed)
History and Physical Interval Note:  02/28/2014 7:08 AM  Melinda Holden  has presented today for surgery, with the diagnosis of RIGHT KNEE DEGENERATIVE JOINT DISEASE  The various methods of treatment have been discussed with the patient and family. After consideration of risks, benefits and other options for treatment, the patient has consented to  Procedure(s): TOTAL KNEE ARTHROPLASTY (Right) as a surgical intervention .  The patient's history has been reviewed, patient examined, no change in status, stable for surgery.  I have reviewed the patient's chart and labs.  Questions were answered to the patient's satisfaction.     [name]

## 2014-02-28 NOTE — Anesthesia Procedure Notes (Addendum)
Spinal  Patient location during procedure: holding area Start time: 02/28/2014 7:09 AM End time: 02/28/2014 7:19 AM Staffing Performed by: anesthesiologist  Preanesthetic Checklist Completed: patient identified, site marked, surgical consent, pre-op evaluation, timeout performed, IV checked, risks and benefits discussed and monitors and equipment checked Spinal Block Patient position: sitting Prep: Betadine Patient monitoring: heart rate, cardiac monitor, continuous pulse ox and blood pressure Approach: midline Location: L3-4 Injection technique: single-shot Needle Needle type: Pencan  Needle gauge: 24 G Needle length: 9 cm  Procedure Name: MAC Date/Time: 02/28/2014 7:26 AM Performed by: Carmela RimaMARTINELLI, Arrie Zuercher F Pre-anesthesia Checklist: Suction available, Patient being monitored, Emergency Drugs available, Timeout performed and Patient identified Patient Re-evaluated:Patient Re-evaluated prior to inductionOxygen Delivery Method: Simple face mask Placement Confirmation: positive ETCO2 Dental Injury: Teeth and Oropharynx as per pre-operative assessment

## 2014-02-28 NOTE — Evaluation (Addendum)
Physical Therapy Evaluation Patient Details Name: Melinda Holden MRN: [id number] DOB: 09-Mar-1956 Today's Date: 02/28/2014   History of Present Illness  Pt admitted 7/21 for elective R TKA. Pt with h/o obesity and DM.  Clinical Impression  Pt is s/p TKA resulting in the deficits listed below (see PT Problem List). Pt tolerated OOB mobility well for first time up OOB. Pt will benefit from skilled PT to increase their independence and safety with mobility to allow discharge to the venue listed below.      Follow Up Recommendations Home health PT;Supervision/Assistance - 24 hour    Equipment Recommendations  None recommended by PT (pt has RW and 3n1)    Recommendations for Other Services       Precautions / Restrictions Precautions Precautions: Knee;Fall Precaution Booklet Issued: Yes (comment) Precaution Comments: educated on HEP Required Braces or Orthoses: Knee Immobilizer - Right Knee Immobilizer - Right: Discontinue post op day 2 Restrictions Weight Bearing Restrictions: No      Mobility  Bed Mobility Overal bed mobility: Needs Assistance Bed Mobility: Supine to Sit     Supine to sit: Mod assist     General bed mobility comments: assist to bring hips to EOB and trunk elevation due to body habitus  Transfers Overall transfer level: Needs assistance Equipment used: Rolling walker (2 wheeled) Transfers: Sit to/from [employer]Stand;Stand Pivot Transfers Sit to Stand: Min assist Stand pivot transfers: Min assist       General transfer comment: v/c's for safety, hand placement, sequencing with walker, pt with no knee buckling during std pvt to chair  Ambulation/Gait Ambulation/Gait assistance: Min assist Ambulation Distance (Feet):  (5 steps to chair) Assistive device: Rolling walker (2 wheeled) Gait Pattern/deviations: Step-to pattern     General Gait Details: v/c's for sequencing  Stairs            Wheelchair Mobility    Modified Rankin (Stroke Patients  Only)       Balance Overall balance assessment:  (needs RW due to R Knee instability for safe amb)                                           Pertinent Vitals/Pain Pt reports significant R knee pain but did not rate    Home Living Family/patient expects to be discharged to:: Private residence Living Arrangements: Spouse/significant other Available Help at Discharge: Family;Available 24 hours/day Type of Home: Apartment Home Access: Elevator     Home Layout: One level Home Equipment: Walker - 2 wheels;Bedside commode      Prior Function Level of Independence: Independent         Comments: works from home     Hand Dominance   Dominant Hand: Right    Extremity/Trunk Assessment   Upper Extremity Assessment: Overall WFL for tasks assessed           Lower Extremity Assessment: RLE deficits/detail RLE Deficits / Details: able to complete quad set, achieve 60 knee flexion in sitting    Cervical / Trunk Assessment: Normal  Communication   Communication: No difficulties  Cognition Arousal/Alertness: Awake/alert Behavior During Therapy: WFL for tasks assessed/performed Overall Cognitive Status: Within Functional Limits for tasks assessed                      General Comments General comments (skin integrity, edema, etc.): [facility] and was dependent  for hygiene due to body habitus    Exercises Total Joint Exercises Ankle Circles/Pumps: AROM;Both;10 reps;Supine Quad Sets: AROM;Right;10 reps;Seated Heel Slides: AROM;Right;10 reps;Seated      Assessment/Plan    PT Assessment Patient needs continued PT services  PT Diagnosis Difficulty walking;Acute pain   PT Problem List Decreased strength;Decreased range of motion;Decreased activity tolerance;Decreased mobility  PT Treatment Interventions DME instruction;Gait training;Functional mobility training;Therapeutic activities;Therapeutic exercise;Balance training   PT Goals  (Current goals can be found in the Care Plan section) Acute Rehab PT Goals Patient Stated Goal: home PT Goal Formulation: With patient Time For Goal Achievement: 03/07/14 Potential to Achieve Goals: Good    Frequency 7X/week   Barriers to discharge        Co-evaluation               End of Session Equipment Utilized During Treatment: Gait belt Activity Tolerance: Patient tolerated treatment well Patient left: in chair;with call bell/phone within reach;with family/visitor present Nurse Communication: Mobility status         Time: 1610-9604 PT Time Calculation (min): 26 min   Charges:   PT Evaluation $Initial PT Evaluation Tier I: 1 Procedure PT Treatments $Therapeutic Activity: 8-22 mins   PT G CodesMarcene Brawn 02/28/2014, 4:51 PM  Lewis Shock, PT, DPT Pager #: [phone] Office #: [phone]

## 2014-02-28 NOTE — Transfer of Care (Signed)
Immediate Anesthesia Transfer of Care Note  Patient: Melinda ScheuermannJanet E Holden  Procedure(s) Performed: Procedure(s): TOTAL KNEE ARTHROPLASTY (Right)  Patient Location: PACU  Anesthesia Type:Spinal  Level of Consciousness: awake, alert  and oriented  Airway & Oxygen Therapy: Patient Spontanous Breathing and Patient connected to nasal cannula oxygen  Post-op Assessment: Report given to PACU RN, Post -op Vital signs reviewed and stable and Patient moving all extremities X 4  Post vital signs: Reviewed and stable  Complications: No apparent anesthesia complications

## 2014-02-28 NOTE — Progress Notes (Signed)
Utilization review completed.  

## 2014-02-28 NOTE — Anesthesia Preprocedure Evaluation (Addendum)
Anesthesia Evaluation  Patient identified by MRN, date of birth, ID band Patient awake    Reviewed: Allergy & Precautions, H&P , NPO status , Patient's Chart, lab work & pertinent test results  Airway Mallampati: II TM Distance: >3 FB Neck ROM: full    Dental  (+) Teeth Intact, Dental Advidsory Given   Pulmonary shortness of breath and with exertion,    Pulmonary exam normal       Cardiovascular hypertension,     Neuro/Psych    GI/Hepatic GERD-  Medicated and Controlled,  Endo/Other  diabetesHypothyroidism Morbid obesity  Renal/GU      Musculoskeletal   Abdominal   Peds  Hematology   Anesthesia Other Findings   Reproductive/Obstetrics                         Anesthesia Physical Anesthesia Plan  ASA: III  Anesthesia Plan: Spinal and MAC   Post-op Pain Management:    Induction: Intravenous  Airway Management Planned:   Additional Equipment:   Intra-op Plan:   Post-operative Plan:   Informed Consent: I have reviewed the patients History and Physical, chart, labs and discussed the procedure including the risks, benefits and alternatives for the proposed anesthesia with the patient or authorized representative who has indicated his/her understanding and acceptance.   Dental Advisory Given and Dental advisory given  Plan Discussed with: Anesthesiologist, CRNA and Surgeon  Anesthesia Plan Comments:       Anesthesia Quick Evaluation

## 2014-02-28 NOTE — Anesthesia Postprocedure Evaluation (Signed)
Anesthesia Post Note  Patient: Melinda ScheuermannJanet E Holden  Procedure(s) Performed: Procedure(s) (LRB): TOTAL KNEE ARTHROPLASTY (Right)  Anesthesia type: MAC/SAB  Patient location: PACU  Post pain: Pain level controlled  Post assessment: Patient's Cardiovascular Status Stable  Last Vitals:  Filed Vitals:   02/28/14 1040  BP:   Pulse: 78  Temp:   Resp: 17    Post vital signs: Reviewed and stable  Level of consciousness: sedated  Complications: No apparent anesthesia complications

## 2014-02-28 NOTE — Op Note (Signed)
PREOP DIAGNOSIS: DJD RIGHT KNEE POSTOP DIAGNOSIS: same PROCEDURE: RIGHT TKR ANESTHESIA: Spinal and MAC ATTENDING SURGEON: Citlalli Weikel G ASSISTANT: Elodia FlorenceAndrew Nida PA  INDICATIONS FOR PROCEDURE: Melinda Holden is a 58 y.o. female who has struggled for a long time with pain due to degenerative arthritis of the right knee.  The patient has failed many conservative non-operative measures and at this point has pain which limits the ability to sleep and walk.  The patient is offered total knee replacement.  Informed operative consent was obtained after discussion of possible risks of anesthesia, infection, neurovascular injury, DVT, and death.  The importance of the post-operative rehabilitation protocol to optimize result was stressed extensively with the patient.  SUMMARY OF FINDINGS AND PROCEDURE:  Melinda Holden was taken to the operative suite where under the above anesthesia a right knee replacement was performed.  There were advanced degenerative changes and the bone quality was fair.  We used the DePuy LCS system and placed size standard femur, 3 tibia, 35 mm all polyethylene patella, and a size 17.5 mm spacer.  Elodia FlorenceAndrew Nida PA-C assisted throughout and was invaluable to the completion of the case in that he helped retract and maintain exposure while I placed components.  He also helped close thereby minimizing OR time.  The patient was admitted for appropriate post-op care to include perioperative antibiotics and mechanical and pharmacologic measures for DVT prophylaxis.  DESCRIPTION OF PROCEDURE:  Melinda Holden was taken to the operative suite where the above anesthesia was applied.  The patient was positioned supine and prepped and draped in normal sterile fashion.  An appropriate time out was performed.  After the administration of vancomycin pre-op antibiotic the leg was elevated and exsanguinated and a tourniquet inflated. A standard longitudinal incision was made on the anterior knee.   Dissection was carried down to the extensor mechanism.  All appropriate anti-infective measures were used including the pre-operative antibiotic, betadine impregnated drape, and closed hooded exhaust systems for each member of the surgical team.  A medial parapatellar incision was made in the extensor mechanism and the knee cap flipped and the knee flexed.  Some residual meniscal tissues were removed along with any remaining ACL/PCL tissue.  A guide was placed on the tibia and a flat cut was made on it's superior surface.  An intramedullary guide was placed in the femur and was utilized to make anterior and posterior cuts creating an appropriate flexion gap.  A second intramedullary guide was placed in the femur to make a distal cut properly balancing the knee with an extension gap equal to the flexion gap.  The three bones sized to the above mentioned sizes and the appropriate guides were placed and utilized.  A trial reduction was done and the knee easily came to full extension and the patella tracked well on flexion.  The trial components were removed and all bones were cleaned with pulsatile lavage and then dried thoroughly.  Cement was mixed and was pressurized onto the bones followed by placement of the aforementioned components.  Excess cement was trimmed and pressure was held on the components until the cement had hardened.  The tourniquet was deflated and a small amount of bleeding was controlled with cautery and pressure.  The knee was irrigated thoroughly.  The extensor mechanism was re-approximated with V-loc suture in running fashion.  The knee was flexed and the repair was solid.  The subcutaneous tissues were re-approximated with #0 and #2-0 vicryl and the skin closed with a  subcuticular stitch and steristrips.  A sterile dressing was applied.  Intraoperative fluids, EBL, and tourniquet time can be obtained from anesthesia records.  DISPOSITION:  The patient was taken to recovery room in stable  condition and admitted for appropriate post-op care to include peri-operative antibiotic and DVT prophylaxis with mechanical and pharmacologic measures.  Mehtaab Mayeda G 02/28/2014, 9:14 AM

## 2014-02-28 NOTE — Addendum Note (Signed)
Addendum created 02/28/14 1121 by Carmela RimaJohn F Hedaya Latendresse, CRNA   Modules edited: Anesthesia Medication Administration

## 2014-03-01 ENCOUNTER — Encounter (HOSPITAL_COMMUNITY): Payer: Self-pay | Admitting: Orthopaedic Surgery

## 2014-03-01 LAB — CBC
HEMATOCRIT: 37.2 % (ref 36.0–46.0)
HEMOGLOBIN: 12.2 g/dL (ref 12.0–15.0)
MCH: 29.5 pg (ref 26.0–34.0)
MCHC: 32.8 g/dL (ref 30.0–36.0)
MCV: 90.1 fL (ref 78.0–100.0)
Platelets: 253 10*3/uL (ref 150–400)
RBC: 4.13 MIL/uL (ref 3.87–5.11)
RDW: 13.2 % (ref 11.5–15.5)
WBC: 10.8 10*3/uL — ABNORMAL HIGH (ref 4.0–10.5)

## 2014-03-01 LAB — BASIC METABOLIC PANEL
Anion gap: 14 (ref 5–15)
BUN: 11 mg/dL (ref 6–23)
CALCIUM: 9 mg/dL (ref 8.4–10.5)
CO2: 25 meq/L (ref 19–32)
Chloride: 98 mEq/L (ref 96–112)
Creatinine, Ser: 0.72 mg/dL (ref 0.50–1.10)
GFR calc Af Amer: >90 mL/min (ref >90)
GFR calc non Af Amer: >90 mL/min (ref >90)
GLUCOSE: 195 mg/dL — AB (ref 70–99)
POTASSIUM: 4.9 meq/L (ref 3.7–5.3)
Sodium: 137 mEq/L (ref 137–147)

## 2014-03-01 LAB — GLUCOSE, CAPILLARY
GLUCOSE-CAPILLARY: 181 mg/dL — AB (ref 70–99)
Glucose-Capillary: 266 mg/dL — ABNORMAL HIGH (ref 70–99)

## 2014-03-01 MED ORDER — METHOCARBAMOL 500 MG PO TABS: 500.0000 mg | ORAL_TABLET | Freq: Four times a day (QID) | ORAL | Status: DC | PRN

## 2014-03-01 MED ORDER — ASPIRIN 325 MG PO TBEC: 325.0000 mg | DELAYED_RELEASE_TABLET | Freq: Two times a day (BID) | ORAL | Status: DC

## 2014-03-01 MED ORDER — HYDROCODONE-ACETAMINOPHEN 5-325 MG PO TABS: 1.0000 | ORAL_TABLET | ORAL | Status: DC | PRN

## 2014-03-01 MED ORDER — PNEUMOCOCCAL VAC POLYVALENT 25 MCG/0.5ML IJ INJ
0.5000 mL | INJECTION | INTRAMUSCULAR | Status: DC
  Filled 2014-03-01: qty 0.5

## 2014-03-01 NOTE — Progress Notes (Signed)
Physical Therapy Treatment Patient Details Name: Melinda ScheuermannJanet E Holden MRN: [id number] DOB: [birth year] Today's Date: 03/01/2014    History of Present Illness Pt admitted 7/21 for elective R TKA. Pt with h/o obesity and DM.    PT Comments    Pt c/o "burning" in her knee from the staples. Pt gait was limited by pain and c/o of burning again during exercises but pushed herself through the exercises. Pt planning on D/C this afternoon to home.  Follow Up Recommendations  Home health PT;Supervision/Assistance - 24 hour     Equipment Recommendations  None recommended by PT    Recommendations for Other Services       Precautions / Restrictions Precautions Precautions: Knee    Mobility  Bed Mobility Overal bed mobility: Modified Independent                Transfers Overall transfer level: Needs assistance Equipment used: Rolling walker (2 wheeled) Transfers: Sit to/from Stand Sit to Stand: Supervision         General transfer comment: vc for sitting for proper leg placement at chair and for a controlled descend when sitting.   Ambulation/Gait Ambulation/Gait assistance: Min guard Ambulation Distance (Feet): 70 Feet Assistive device: Rolling walker (2 wheeled) Gait Pattern/deviations: Step-to pattern;Decreased stride length;Antalgic Gait velocity: decreased Gait velocity interpretation: Below normal speed for age/gender General Gait Details: VC for sequencing with RW.    Stairs            Wheelchair Mobility    Modified Rankin (Stroke Patients Only)       Balance                                    Cognition Arousal/Alertness: Awake/alert Behavior During Therapy: WFL for tasks assessed/performed Overall Cognitive Status: Within Functional Limits for tasks assessed                      Exercises Total Joint Exercises Ankle Circles/Pumps: AROM;10 reps;Seated;Both Quad Sets: AROM;Right;10 reps;Seated Heel Slides: AAROM;5  reps;Right;Seated Hip ABduction/ADduction: AAROM;Seated;Right;10 reps Straight Leg Raises: AROM;Seated;Right;10 reps Long Arc Quad: Seated;Right;5 reps;AROM    General Comments        Pertinent Vitals/Pain no apparent distress. Pt repositioned in recliner for comfort in footsie roll.      Home Living                      Prior Function            PT Goals (current goals can now be found in the care plan section) Progress towards PT goals: Progressing toward goals    Frequency  7X/week    PT Plan Current plan remains appropriate    Co-evaluation             End of Session Equipment Utilized During Treatment: Gait belt Activity Tolerance: Patient tolerated treatment well Patient left: in chair;with call bell/phone within reach;with nursing/sitter in room     Time: [id number] PT Time Calculation (min): 18 min  Charges:                       G Codes:      BRASFIELD,Melinda Holden,SPTA 03/01/2014, 10:20 AM

## 2014-03-01 NOTE — Progress Notes (Signed)
Subjective: 1 Day Post-Op Procedure(s) (LRB): TOTAL KNEE ARTHROPLASTY (Right)  Activity level:  wbat Diet tolerance:  Eating well Voiding:  ok Patient reports pain as mild.    Objective: Vital signs in last 24 hours: Temp:  [97.5 F (36.4 C)-99.2 F (37.3 C)] 99.2 F (37.3 C) (07/22 0441) Pulse Rate:  [63-102] 102 (07/22 0441) Resp:  [9-20] 18 (07/22 0441) BP: (120-149)/(57-95) 130/62 mmHg (07/22 0441) SpO2:  [96 %-100 %] 97 % (07/22 0441)  Labs:  Recent Labs  03/01/14 0555  HGB 12.2    Recent Labs  03/01/14 0555  WBC 10.8*  RBC 4.13  HCT 37.2  PLT 253    Recent Labs  03/01/14 0555  NA 137  K 4.9  CL 98  CO2 25  BUN 11  CREATININE 0.72  GLUCOSE 195*  CALCIUM 9.0   No results found for this basename: LABPT, INR,  in the last 72 hours  Physical Exam:  Neurologically intact ABD soft Neurovascular intact Sensation intact distally Intact pulses distally Dorsiflexion/Plantar flexion intact Incision: dressing C/D/I No cellulitis present Compartment soft  Assessment/Plan:  1 Day Post-Op Procedure(s) (LRB): TOTAL KNEE ARTHROPLASTY (Right) Advance diet Up with therapy D/C IV fluids Discharge home with home health today after PT this afternoon. ASA 325mg  BID x 2 weeks post op for DVT prevention. Follow up in office 2 weeks postop. Dressing change to mepilex.    Melinda Holden, Ginger OrganNDREW PAUL 03/01/2014, 8:01 AM

## 2014-03-01 NOTE — Progress Notes (Signed)
Discharge instructions reviewed with patient and handout given. Verbalizes understanding. Pt's spouse to be ride home.

## 2014-03-01 NOTE — Evaluation (Signed)
Occupational Therapy Evaluation Patient Details Name: Melinda Holden MRN: [id number] DOB: [birth year] Today's Date: 03/01/2014    History of Present Illness Pt admitted 7/21 for elective R TKA. Pt with h/o obesity and DM.   Clinical Impression   Pt seen for acute OT evaluation for above diagnosis. PTA pt independent with ADLs with occasional use of cane. Currently pt is min guard for OOB ADLs. Pt has family to provide 24 hour assistance at d/c. ADL education given. No further OT needs at this time.   Follow Up Recommendations  Supervision/Assistance - 24 hour;No OT follow up    Equipment Recommendations  None recommended by OT;Other (comment) (pt has recommended DME and AE)    Recommendations for Other Services       Precautions / Restrictions Precautions Precautions: Knee Restrictions Weight Bearing Restrictions: Yes RLE Weight Bearing: Weight bearing as tolerated      Mobility Bed Mobility Overal bed mobility: Needs Assistance Bed Mobility: Supine to Sit;Sit to Supine     Supine to sit: Supervision Sit to supine: Supervision   General bed mobility comments: uses leg lifter  Transfers Overall transfer level: Needs assistance Equipment used: Rolling walker (2 wheeled) Transfers: Sit to/from Stand Sit to Stand: Supervision         General transfer comment: vc for sitting for proper leg placement at chair and for a controlled descend when sitting.     Balance                                            ADL Overall ADL's : Needs assistance/impaired Eating/Feeding: Set up;Sitting   Grooming: Set up;Sitting;Standing   Upper Body Bathing: Set up;Sitting   Lower Body Bathing: Min guard;Sit to/from stand   Upper Body Dressing : Set up;Sitting   Lower Body Dressing: Min guard;Sit to/from stand   Toilet Transfer: Min guard;Ambulation;BSC;RW   Toileting- Architect and Hygiene: Min guard;Sit to/from stand   Tub/ Shower  Transfer: Min guard;Ambulation;3 in 1;Rolling walker   Functional mobility during ADLs: Min guard;Rolling walker General ADL Comments: Educated on techniques and use of AE for safe completion of ADLs.     Vision                     Perception     Praxis      Pertinent Vitals/Pain 4/10 R knee     Hand Dominance Right   Extremity/Trunk Assessment Upper Extremity Assessment Upper Extremity Assessment: Overall WFL for tasks assessed   Lower Extremity Assessment Lower Extremity Assessment: Defer to PT evaluation   Cervical / Trunk Assessment Cervical / Trunk Assessment: Normal   Communication Communication Communication: No difficulties   Cognition Arousal/Alertness: Awake/alert Behavior During Therapy: WFL for tasks assessed/performed Overall Cognitive Status: Within Functional Limits for tasks assessed                     General Comments       Exercises       Shoulder Instructions      Home Living Family/patient expects to be discharged to:: Private residence Living Arrangements: Spouse/significant other Available Help at Discharge: Family;Available 24 hours/day Type of Home: Apartment Home Access: Elevator     Home Layout: One level     Bathroom Shower/Tub: Tub/shower unit Shower/tub characteristics: Door Firefighter: Standard     Home Equipment: Environmental consultant -  2 wheels;Bedside commode;Adaptive equipment Adaptive Equipment: Reacher;Sock aid;Long-handled shoe horn;Other (Comment) (leg lifter)        Prior Functioning/Environment Level of Independence: Independent             OT Diagnosis:     OT Problem List:     OT Treatment/Interventions:      OT Goals(Current goals can be found in the care plan section) Acute Rehab OT Goals Patient Stated Goal: walk better, family vacation  OT Frequency:     Barriers to D/C:            Co-evaluation              End of Session Equipment Utilized During Treatment: Gait  belt;Rolling walker CPM Right Knee CPM Right Knee: On Right Knee Flexion (Degrees): 55 Right Knee Extension (Degrees): -1  Activity Tolerance: Patient tolerated treatment well Patient left: in bed;in CPM;with call bell/phone within reach;with family/visitor present   Time: [id number] OT Time Calculation (min): 23 min Charges:  OT General Charges $OT Visit: 1 Procedure OT Evaluation $Initial OT Evaluation Tier I: 1 Procedure OT Treatments $Self Care/Home Management : 8-22 mins G-Codes:    Pilar GrammesMathews, Sir Mallis H 03/01/2014, 12:52 PM

## 2014-03-01 NOTE — Discharge Summary (Signed)
Patient ID: TANAYSHA ALKINS MRN: [id number] DOB/AGE: November 15, 1955 58 y.o.  Admit date: 02/28/2014 Discharge date: 03/01/2014  Admission Diagnoses:  Principal Problem:   Right knee DJD Active Problems:   Morbid obesity   Diabetes   Discharge Diagnoses:  Same  Past Medical History  Diagnosis Date  . Hypertension   . Shortness of breath     exertion  . Hypothyroidism   . Diabetes mellitus without complication   . GERD (gastroesophageal reflux disease)   . Arthritis     Surgeries: Procedure(s): TOTAL KNEE ARTHROPLASTY on 02/28/2014   Consultants:    Discharged Condition: Improved  Hospital Course: SAYDA GRABLE is an 58 y.o. female who was admitted 02/28/2014 for operative treatment ofRight knee DJD. Patient has severe unremitting pain that affects sleep, daily activities, and work/hobbies. After pre-op clearance the patient was taken to the operating room on 02/28/2014 and underwent  Procedure(s): TOTAL KNEE ARTHROPLASTY.    Patient was given perioperative antibiotics: Anti-infectives   Start     Dose/Rate Route Frequency Ordered Stop   02/28/14 1930  vancomycin (VANCOCIN) IVPB 1000 mg/200 mL premix     1,000 mg 200 mL/hr over 60 Minutes Intravenous Every 12 hours 02/28/14 1230 02/28/14 2139   02/28/14 0600  vancomycin (VANCOCIN) 1,500 mg in sodium chloride 0.9 % 500 mL IVPB     1,500 mg 250 mL/hr over 120 Minutes Intravenous On call to O.R. 02/27/14 1514 02/28/14 0830       Patient was given sequential compression devices, early ambulation, and chemoprophylaxis to prevent DVT.  Patient benefited maximally from hospital stay and there were no complications.    Recent vital signs: Patient Vitals for the past 24 hrs:  BP Temp Temp src Pulse Resp SpO2  03/01/14 0441 130/62 mmHg 99.2 F (37.3 C) Oral 102 18 97 %  03/01/14 0400 - - - - 18 97 %  03/01/14 0052 136/68 mmHg 97.7 F (36.5 C) - 75 18 98 %  03/01/14 0000 - - - - 18 98 %  02/28/14 2009 149/95 mmHg 97.7  F (36.5 C) - 88 18 97 %  02/28/14 2000 - - - - 18 99 %  02/28/14 1600 - - - - 18 98 %  02/28/14 1222 - 97.8 F (36.6 C) - 70 14 100 %  02/28/14 1209 - - - 70 11 99 %  02/28/14 1205 135/61 mmHg 97.5 F (36.4 C) - 74 13 99 %  02/28/14 1200 - - - 74 10 98 %  02/28/14 1145 - - - 90 15 96 %  02/28/14 1130 - - - 79 15 99 %  02/28/14 1124 134/58 mmHg - - 74 15 99 %  02/28/14 1115 134/58 mmHg - - 66 10 99 %  02/28/14 1100 - - - 66 12 99 %  02/28/14 1049 125/60 mmHg - - 69 9 99 %  02/28/14 1045 125/60 mmHg 97.5 F (36.4 C) - 70 9 97 %  02/28/14 1040 - - - 78 17 99 %  02/28/14 1034 120/66 mmHg - - 66 11 99 %  02/28/14 1030 - - - 65 10 100 %  02/28/14 1019 134/73 mmHg - - 70 12 99 %  02/28/14 1015 - - - 71 14 99 %  02/28/14 1010 - - - 68 14 100 %  02/28/14 1004 129/57 mmHg - - 71 12 100 %  02/28/14 1000 - - - 63 10 100 %  02/28/14 0949 121/57 mmHg 97.5 F (36.4 C) -  74 20 100 %     Recent laboratory studies:  Recent Labs  03/01/14 0555  WBC 10.8*  HGB 12.2  HCT 37.2  PLT 253  NA 137  K 4.9  CL 98  CO2 25  BUN 11  CREATININE 0.72  GLUCOSE 195*  CALCIUM 9.0     Discharge Medications:     Medication List    STOP taking these medications       Omega-3 Krill Oil 500 MG Caps      TAKE these medications       aspirin 325 MG EC tablet  Take 1 tablet (325 mg total) by mouth 2 (two) times daily after a meal.     atorvastatin 80 MG tablet  Commonly known as:  LIPITOR  Take 80 mg by mouth daily.     calcium carbonate 750 MG chewable tablet  Commonly known as:  TUMS EX  Chew 1 tablet by mouth daily as needed for heartburn.     famotidine 20 MG tablet  Commonly known as:  PEPCID  Take 20 mg by mouth daily as needed for heartburn or indigestion.     glucosamine-chondroitin 500-400 MG tablet  Take 2 tablets by mouth daily.     HYDROcodone-acetaminophen 5-325 MG per tablet  Commonly known as:  NORCO/VICODIN  Take 1-2 tablets by mouth every 4 (four) hours as needed  (breakthrough pain).     insulin detemir 100 UNIT/ML injection  Commonly known as:  LEVEMIR  Inject 75 Units into the skin 2 (two) times daily.     insulin lispro 100 UNIT/ML injection  Commonly known as:  HUMALOG  Inject 22 Units into the skin 2 (two) times daily.     ISTALOL 0.5 % (DAILY) Soln  Generic drug:  Timolol Maleate  Place 1 drop into both eyes daily.     levothyroxine 75 MCG tablet  Commonly known as:  SYNTHROID, LEVOTHROID  Take 75 mcg by mouth daily before breakfast.     lisinopril 20 MG tablet  Commonly known as:  PRINIVIL,ZESTRIL  Take 20 mg by mouth daily.     metFORMIN 500 MG 24 hr tablet  Commonly known as:  GLUCOPHAGE-XR  Take 1,000 mg by mouth 2 (two) times daily.     methocarbamol 500 MG tablet  Commonly known as:  ROBAXIN  Take 1 tablet (500 mg total) by mouth every 6 (six) hours as needed for muscle spasms.     MULTIVITAMIN PO  Take 1 tablet by mouth daily.     Vitamin D 2000 UNITS tablet  Take 2,000 Units by mouth daily.        Diagnostic Studies: No results found.  Disposition: Final discharge disposition not confirmed      Discharge Instructions   Call MD / Call 911    Complete by:  As directed   If you experience chest pain or shortness of breath, CALL 911 and be transported to the hospital emergency room.  If you develope a fever above 101 F, pus (white drainage) or increased drainage or redness at the wound, or calf pain, call your surgeon's office.     Constipation Prevention    Complete by:  As directed   Drink plenty of fluids.  Prune juice may be helpful.  You may use a stool softener, such as Colace (over the counter) 100 mg twice a day.  Use MiraLax (over the counter) for constipation as needed.     Diet - low sodium heart healthy  Complete by:  As directed      Increase activity slowly as tolerated    Complete by:  As directed            Follow-up Information   Follow up with DALLDORF,PETER G, MD. Call in 2 weeks.    Specialty:  Orthopedic Surgery   Contact information:   [address] [place] [address] [phone]        Signed: Drema Halon 03/01/2014, 8:05 AM

## 2014-03-01 NOTE — Progress Notes (Signed)
Seen and agreed 03/01/2014 Stevie Ertle Elizabeth PTA [phone] pager [phone] office    

## 2014-03-01 NOTE — Progress Notes (Signed)
Physical Therapy Treatment Patient Details Name: Melinda ScheuermannJanet E Holden MRN: [id number] DOB: [birth year] Today's Date: 03/01/2014    History of Present Illness Pt admitted 7/21 for elective R TKA. Pt with h/o obesity and DM.    PT Comments    Pt amb much better this session. Pt doing well overall with mobility and exercises, limited by fatigue from earlier session. Pt planning to D/C this afternoon to home.  Follow Up Recommendations  Home health PT;Supervision/Assistance - 24 hour     Equipment Recommendations  None recommended by PT    Recommendations for Other Services       Precautions / Restrictions Precautions Precautions: Knee Restrictions Weight Bearing Restrictions: Yes RLE Weight Bearing: Weight bearing as tolerated    Mobility  Bed Mobility Overal bed mobility: Needs Assistance Bed Mobility: Supine to Sit;Sit to Supine     Supine to sit: Supervision Sit to supine: Supervision   General bed mobility comments: pt at EOB at beginning of session and in recliner at end of session.  Transfers Overall transfer level: Needs assistance Equipment used: Rolling walker (2 wheeled) Transfers: Sit to/from Stand Sit to Stand: Supervision         General transfer comment: supervision for safety. Pt had safe hand placements and good balance.   Ambulation/Gait Ambulation/Gait assistance: Min guard Ambulation Distance (Feet): 70 Feet Assistive device: Rolling walker (2 wheeled) Gait Pattern/deviations: Step-to pattern;Decreased stride length;Antalgic Gait velocity: decreased Gait velocity interpretation: Below normal speed for age/gender General Gait Details: Pt still complained of knee burning from staples. Gait velocity slowly increasing.   Stairs            Wheelchair Mobility    Modified Rankin (Stroke Patients Only)       Balance                                    Cognition Arousal/Alertness: Awake/alert Behavior During Therapy:  WFL for tasks assessed/performed Overall Cognitive Status: Within Functional Limits for tasks assessed                      Exercises Total Joint Exercises Ankle Circles/Pumps: AROM;Seated;Both;10 reps Quad Sets: AROM;Seated;Right;10 reps Heel Slides: AAROM;Seated;Right;10 reps Hip ABduction/ADduction: Seated;AROM;Right;10 reps Straight Leg Raises: AAROM;Seated;Right;10 reps Long Arc Quad: AROM;Seated;Right;10 reps    General Comments        Pertinent Vitals/Pain no apparent distress. Pt repositioned in recliner for comfort in footsie roll.      Home Living Family/patient expects to be discharged to:: Private residence Living Arrangements: Spouse/significant other Available Help at Discharge: Family;Available 24 hours/day Type of Home: Apartment Home Access: Elevator   Home Layout: One level Home Equipment: Walker - 2 wheels;Bedside commode;Adaptive equipment      Prior Function Level of Independence: Independent          PT Goals (current goals can now be found in the care plan section) Acute Rehab PT Goals Patient Stated Goal: walk better, family vacation Progress towards PT goals: Progressing toward goals    Frequency  7X/week    PT Plan Current plan remains appropriate    Co-evaluation             End of Session Equipment Utilized During Treatment: Gait belt Activity Tolerance: Patient tolerated treatment well Patient left: in chair;with call bell/phone within reach;with family/visitor present     Time: [id number] PT Time Calculation (min): 17 min  Charges:  $  Therapeutic Exercise: 8-22 mins                    G Codes:      BRASFIELD,Louis Ivery,SPTA 03/01/2014, 2:27 PM

## 2014-03-02 NOTE — Progress Notes (Signed)
Seen anda greed 03/02/2014 Robinette, Julia Elizabeth PTA [phone] pager [phone] office    

## 2014-05-25 ENCOUNTER — Other Ambulatory Visit: Payer: Self-pay | Admitting: Orthopaedic Surgery

## 2014-06-07 ENCOUNTER — Encounter (HOSPITAL_COMMUNITY): Payer: Self-pay | Admitting: Pharmacy Technician

## 2014-06-12 ENCOUNTER — Encounter (HOSPITAL_COMMUNITY)
Admission: RE | Admit: 2014-06-12 | Discharge: 2014-06-12 | Disposition: A | Discharge status: Home / Self Care | Payer: 59 | Source: Ambulatory Visit | Attending: Orthopaedic Surgery | Admitting: Orthopaedic Surgery

## 2014-06-12 ENCOUNTER — Encounter (HOSPITAL_COMMUNITY): Payer: Self-pay

## 2014-06-12 ENCOUNTER — Other Ambulatory Visit: Payer: Self-pay | Admitting: Orthopaedic Surgery

## 2014-06-12 DIAGNOSIS — M179 Osteoarthritis of knee, unspecified: Secondary | ICD-10-CM | POA: Diagnosis not present

## 2014-06-12 DIAGNOSIS — Z01812 Encounter for preprocedural laboratory examination: Secondary | ICD-10-CM | POA: Diagnosis present

## 2014-06-12 LAB — CBC
HEMATOCRIT: 42 % (ref 36.0–46.0)
HEMOGLOBIN: 14.1 g/dL (ref 12.0–15.0)
MCH: 28.7 pg (ref 26.0–34.0)
MCHC: 33.6 g/dL (ref 30.0–36.0)
MCV: 85.5 fL (ref 78.0–100.0)
Platelets: 250 10*3/uL (ref 150–400)
RBC: 4.91 MIL/uL (ref 3.87–5.11)
RDW: 12.8 % (ref 11.5–15.5)
WBC: 8.1 10*3/uL (ref 4.0–10.5)

## 2014-06-12 LAB — TYPE AND SCREEN
ABO/RH(D): A NEG
Antibody Screen: NEGATIVE

## 2014-06-12 LAB — BASIC METABOLIC PANEL
ANION GAP: 15 (ref 5–15)
BUN: 11 mg/dL (ref 6–23)
CALCIUM: 10.4 mg/dL (ref 8.4–10.5)
CHLORIDE: 101 meq/L (ref 96–112)
CO2: 24 meq/L (ref 19–32)
Creatinine, Ser: 0.64 mg/dL (ref 0.50–1.10)
GFR calc Af Amer: >90 mL/min (ref >90)
GFR calc non Af Amer: >90 mL/min (ref >90)
GLUCOSE: 136 mg/dL — AB (ref 70–99)
POTASSIUM: 4.2 meq/L (ref 3.7–5.3)
SODIUM: 140 meq/L (ref 137–147)

## 2014-06-12 LAB — SURGICAL PCR SCREEN
MRSA, PCR: NEGATIVE
STAPHYLOCOCCUS AUREUS: NEGATIVE

## 2014-06-12 NOTE — Progress Notes (Signed)
Reached Dr. Nolon Nationsalldorf's office and asked that the order for OR consent be adjusted to the surgery that is scheduled .

## 2014-06-12 NOTE — Progress Notes (Signed)
Call to Dr. Jerl Santosalldorf re: correction needed to OR consent order, line busy.

## 2014-06-12 NOTE — Pre-Procedure Instructions (Signed)
Melinda ScheuermannJanet E Holden  06/12/2014   Your procedure is scheduled on:  06/20/2014  Report to [facility] Admitting at 11:15 AM.  Call this number if you have problems the morning of surgery: [phone]   Remember:   Do not eat food or drink liquids after midnight.  On on Monday evening    Take these medicines the morning of surgery with A SIP OF WATER: Levothyroxine   Do not wear jewelry, make-up or nail polish.  Do not wear lotions, powders, or perfumes. You may wear deodorant.  Do not shave 48 hours prior to surgery.  Do not bring valuables to the hospital.  [facility] is not responsible                  for any belongings or valuables.               Contacts, dentures or bridgework may not be worn into surgery.  Leave suitcase in the car. After surgery it may be brought to your room.  For patients admitted to the hospital, discharge time is determined by your                treatment team.               Patients discharged the day of surgery will not be allowed to drive  home.  Name and phone number of your driver: /w spouse  Special Instructions: Special Instructions: [facility] - Preparing for Surgery  Before surgery, you can play an important role.  Because skin is not sterile, your skin needs to be as free of germs as possible.  You can reduce the number of germs on you skin by washing with CHG (chlorahexidine gluconate) soap before surgery.  CHG is an antiseptic cleaner which kills germs and bonds with the skin to continue killing germs even after washing.  Please DO NOT use if you have an allergy to CHG or antibacterial soaps.  If your skin becomes reddened/irritated stop using the CHG and inform your nurse when you arrive at Short Stay.  Do not shave (including legs and underarms) for at least 48 hours prior to the first CHG shower.  You may shave your face.  Please follow these instructions carefully:   1.  Shower with CHG Soap the night before surgery and the   morning of Surgery.  2.  If you choose to wash your hair, wash your hair first as usual with your  normal shampoo.  3.  After you shampoo, rinse your hair and body thoroughly to remove the  Shampoo.  4.  Use CHG as you would any other liquid soap.  You can apply chg directly to the skin and wash gently with scrungie or a clean washcloth.  5.  Apply the CHG Soap to your body ONLY FROM THE NECK DOWN.    Do not use on open wounds or open sores.  Avoid contact with your eyes, ears, mouth and genitals (private parts).  Wash genitals (private parts)   with your normal soap.  6.  Wash thoroughly, paying special attention to the area where your surgery will be performed.  7.  Thoroughly rinse your body with warm water from the neck down.  8.  DO NOT shower/wash with your normal soap after using and rinsing off   the CHG Soap.  9.  Pat yourself dry with a clean towel.  10.  Wear clean pajamas.            11.  Place clean sheets on your bed the night of your first shower and do not sleep with pets.  Day of Surgery  Do not apply any lotions/deodorants the morning of surgery.  Please wear clean clothes to the hospital/surgery center.   Please read over the following fact sheets that you were given: Pain Booklet, Coughing and Deep Breathing, Blood Transfusion Information, MRSA Information and Surgical Site Infection Prevention

## 2014-06-16 NOTE — H&P (Signed)
TOTAL KNEE ADMISSION H&P  Patient is being admitted for left total knee arthroplasty.  Subjective:  Chief Complaint:left knee pain.  HPI: Melinda Holden, 58 y.o. female, has a history of pain and functional disability in the left knee due to arthritis and has failed non-surgical conservative treatments for greater than 12 weeks to includeNSAID's and/or analgesics, corticosteriod injections, flexibility and strengthening excercises, supervised PT with diminished ADL's post treatment, use of assistive devices, weight reduction as appropriate and activity modification.  Onset of symptoms was gradual, starting 5 years ago with gradually worsening course since that time. The patient noted no past surgery on the left knee(s).  Patient currently rates pain in the left knee(s) at 10 out of 10 with activity. Patient has night pain, worsening of pain with activity and weight bearing, pain that interferes with activities of daily living, crepitus and joint swelling.  Patient has evidence of subchondral cysts, subchondral sclerosis, periarticular osteophytes and joint space narrowing by imaging studies. There is no active infection.  Patient Active Problem List   Diagnosis Date Noted  . 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 Morbid obesity 02/28/2014  . Diabetes 02/28/2014   Past Medical History  Diagnosis Date  . Hypertension   . Shortness of breath     exertion  . Hypothyroidism   . Diabetes mellitus without complication   . GERD (gastroesophageal reflux disease)   . Arthritis     Past Surgical History  Procedure Laterality Date  . Cesarean section  1995  . Wisdom tooth extraction    . Total knee arthroplasty Right 02/28/2014    Procedure: TOTAL KNEE ARTHROPLASTY;  /w spinal  Surgeon: Velna OchsPeter G Dalldorf, MD;  Location: [facility] OR;  Service: Orthopedics;  Laterality: Right;    No prescriptions prior to admission   Allergies  Allergen Reactions  . Thimerosal Other (See Comments)    Skin reaction   .  Benzalkonium Rash  . Chloramphenicols Rash  . Penicillins Rash    History  Substance Use Topics  . Smoking status: Never Smoker   . Smokeless tobacco: Never Used  . Alcohol Use: No    No family history on file.   Review of Systems  Musculoskeletal: Positive for joint pain.       Left knee  All other systems reviewed and are negative.   Objective:  Physical Exam  Constitutional: She is oriented to person, place, and time. She appears well-developed and well-nourished.  HENT:  Head: Normocephalic and atraumatic.  Eyes: Conjunctivae are normal. Pupils are equal, round, and reactive to light.  Neck: Normal range of motion.  Cardiovascular: Normal rate and regular rhythm.   Respiratory: Effort normal.  GI: Soft.  Musculoskeletal:  Right knee motion is about 0-100. She has some popping on the medial aspect towards full extension. There is no effusion. She has good stability in full extension. Opposite knee motion is slightly less and she has pain on the medial joint line and crepitation. Hip motion is full on both sides and straight leg raise is negative on both sides. Effectively her right leg does seem a bit longer than the left in that her varus deformity is corrected.   Neurological: She is alert and oriented to person, place, and time.  Skin: Skin is warm and dry.  Psychiatric: She has a normal mood and affect. Her behavior is normal. Judgment and thought content normal.    Vital signs in last 24 hours:    Labs:   Estimated body mass index  is 47.52 kg/(m^2) as calculated from the following:   Height as of 02/20/14: 5' (1.524 m).   Weight as of 02/20/14: 110.36 kg (243 lb 4.8 oz).   Imaging Review Plain radiographs demonstrate severe degenerative joint disease of the left knee(s). The overall alignment isneutral. The bone quality appears to be good for age and reported activity level.  Assessment/Plan:  End stage primary arthritis, left knee   The patient history,  physical examination, clinical judgment of the provider and imaging studies are consistent with end stage degenerative joint disease of the left knee(s) and total knee arthroplasty is deemed medically necessary. The treatment options including medical management, injection therapy arthroscopy and arthroplasty were discussed at length. The risks and benefits of total knee arthroplasty were presented and reviewed. The risks due to aseptic loosening, infection, stiffness, patella tracking problems, thromboembolic complications and other imponderables were discussed. The patient acknowledged the explanation, agreed to proceed with the plan and consent was signed. Patient is being admitted for inpatient treatment for surgery, pain control, PT, OT, prophylactic antibiotics, VTE prophylaxis, progressive ambulation and ADL's and discharge planning. The patient is planning to be discharged home with home health services

## 2014-06-19 MED ORDER — VANCOMYCIN HCL 10 G IV SOLR
1500.0000 mg | INTRAVENOUS | Status: AC
  Administered 2014-06-20: 1500 mg via INTRAVENOUS
  Filled 2014-06-19: qty 1500

## 2014-06-20 ENCOUNTER — Inpatient Hospital Stay (HOSPITAL_COMMUNITY): Payer: 59 | Admitting: Anesthesiology

## 2014-06-20 ENCOUNTER — Inpatient Hospital Stay (HOSPITAL_COMMUNITY)
Admission: RE | Admit: 2014-06-20 | Discharge: 2014-06-22 | DRG: 470 | Disposition: A | Discharge status: Home Health | Payer: 59 | Source: Ambulatory Visit | Attending: Orthopaedic Surgery | Admitting: Orthopaedic Surgery

## 2014-06-20 ENCOUNTER — Encounter (HOSPITAL_COMMUNITY): Payer: Self-pay | Admitting: *Deleted

## 2014-06-20 ENCOUNTER — Encounter (HOSPITAL_COMMUNITY)
Admission: RE | Disposition: A | Discharge status: Home Health | Payer: Self-pay | Source: Ambulatory Visit | Attending: Orthopaedic Surgery

## 2014-06-20 DIAGNOSIS — M179 Osteoarthritis of knee, unspecified: Secondary | ICD-10-CM | POA: Diagnosis present

## 2014-06-20 DIAGNOSIS — E039 Hypothyroidism, unspecified: Secondary | ICD-10-CM | POA: Diagnosis present

## 2014-06-20 DIAGNOSIS — M1712 Unilateral primary osteoarthritis, left knee: Secondary | ICD-10-CM | POA: Diagnosis present

## 2014-06-20 DIAGNOSIS — Z96651 Presence of right artificial knee joint: Secondary | ICD-10-CM | POA: Diagnosis present

## 2014-06-20 DIAGNOSIS — I10 Essential (primary) hypertension: Secondary | ICD-10-CM | POA: Diagnosis present

## 2014-06-20 DIAGNOSIS — M25562 Pain in left knee: Secondary | ICD-10-CM | POA: Diagnosis present

## 2014-06-20 DIAGNOSIS — E119 Type 2 diabetes mellitus without complications: Secondary | ICD-10-CM | POA: Diagnosis present

## 2014-06-20 DIAGNOSIS — Z6841 Body Mass Index (BMI) 40.0 and over, adult: Secondary | ICD-10-CM

## 2014-06-20 DIAGNOSIS — K219 Gastro-esophageal reflux disease without esophagitis: Secondary | ICD-10-CM | POA: Diagnosis present

## 2014-06-20 HISTORY — PX: TOTAL KNEE ARTHROPLASTY: SHX125

## 2014-06-20 LAB — DIFFERENTIAL
BASOS PCT: 1 % (ref 0–1)
Basophils Absolute: 0.1 10*3/uL (ref 0.0–0.1)
EOS PCT: 2 % (ref 0–5)
Eosinophils Absolute: 0.1 10*3/uL (ref 0.0–0.7)
Lymphocytes Relative: 33 % (ref 12–46)
Lymphs Abs: 2.4 10*3/uL (ref 0.7–4.0)
Monocytes Absolute: 0.5 10*3/uL (ref 0.1–1.0)
Monocytes Relative: 6 % (ref 3–12)
NEUTROS PCT: 58 % (ref 43–77)
Neutro Abs: 4.2 10*3/uL (ref 1.7–7.7)

## 2014-06-20 LAB — GLUCOSE, CAPILLARY
GLUCOSE-CAPILLARY: 238 mg/dL — AB (ref 70–99)
Glucose-Capillary: 171 mg/dL — ABNORMAL HIGH (ref 70–99)
Glucose-Capillary: 225 mg/dL — ABNORMAL HIGH (ref 70–99)

## 2014-06-20 LAB — APTT: aPTT: 30 seconds (ref 24–37)

## 2014-06-20 LAB — PROTIME-INR
INR: 1.03 (ref 0.00–1.49)
Prothrombin Time: 13.6 seconds (ref 11.6–15.2)

## 2014-06-20 SURGERY — ARTHROPLASTY, KNEE, TOTAL
Anesthesia: Monitor Anesthesia Care | Site: Knee | Laterality: Left

## 2014-06-20 MED ORDER — TRANEXAMIC ACID 100 MG/ML IV SOLN
2000.0000 mg | INTRAVENOUS | Status: AC
  Administered 2014-06-20: 2000 mg via TOPICAL
  Filled 2014-06-20: qty 20

## 2014-06-20 MED ORDER — PHENYLEPHRINE 40 MCG/ML (10ML) SYRINGE FOR IV PUSH (FOR BLOOD PRESSURE SUPPORT)
PREFILLED_SYRINGE | INTRAVENOUS | Status: AC
  Filled 2014-06-20: qty 10

## 2014-06-20 MED ORDER — MIDAZOLAM HCL 5 MG/5ML IJ SOLN
INTRAMUSCULAR | Status: DC | PRN
  Administered 2014-06-20: 2 mg via INTRAVENOUS

## 2014-06-20 MED ORDER — ONDANSETRON HCL 4 MG/2ML IJ SOLN
INTRAMUSCULAR | Status: AC
  Filled 2014-06-20: qty 2

## 2014-06-20 MED ORDER — METFORMIN HCL ER 500 MG PO TB24
1000.0000 mg | ORAL_TABLET | Freq: Two times a day (BID) | ORAL | Status: DC
  Administered 2014-06-21 – 2014-06-22 (×3): 1000 mg via ORAL
  Filled 2014-06-20 (×5): qty 2

## 2014-06-20 MED ORDER — FENTANYL CITRATE 0.05 MG/ML IJ SOLN
INTRAMUSCULAR | Status: AC
  Filled 2014-06-20: qty 2

## 2014-06-20 MED ORDER — LACTATED RINGERS IV SOLN
INTRAVENOUS | Status: DC
  Administered 2014-06-20: 12:00:00 via INTRAVENOUS

## 2014-06-20 MED ORDER — EPHEDRINE SULFATE 50 MG/ML IJ SOLN
INTRAMUSCULAR | Status: DC | PRN
  Administered 2014-06-20: 10 mg via INTRAVENOUS

## 2014-06-20 MED ORDER — VANCOMYCIN HCL 10 G IV SOLR
1500.0000 mg | Freq: Two times a day (BID) | INTRAVENOUS | Status: AC
  Administered 2014-06-21: 1500 mg via INTRAVENOUS
  Filled 2014-06-20: qty 1500

## 2014-06-20 MED ORDER — ASPIRIN EC 325 MG PO TBEC
325.0000 mg | DELAYED_RELEASE_TABLET | Freq: Two times a day (BID) | ORAL | Status: DC
  Administered 2014-06-21 – 2014-06-22 (×3): 325 mg via ORAL
  Filled 2014-06-20 (×5): qty 1

## 2014-06-20 MED ORDER — TIMOLOL MALEATE (ONCE-DAILY) 0.5 % OP SOLN: 1.0000 [drp] | Freq: Every day | OPHTHALMIC | Status: DC

## 2014-06-20 MED ORDER — PROPOFOL 10 MG/ML IV BOLUS
INTRAVENOUS | Status: AC
  Filled 2014-06-20: qty 20

## 2014-06-20 MED ORDER — ACETAMINOPHEN 650 MG RE SUPP: 650.0000 mg | Freq: Four times a day (QID) | RECTAL | Status: DC | PRN

## 2014-06-20 MED ORDER — ACETAMINOPHEN 325 MG PO TABS: 325.0000 mg | ORAL_TABLET | ORAL | Status: DC | PRN

## 2014-06-20 MED ORDER — FAMOTIDINE 20 MG PO TABS
20.0000 mg | ORAL_TABLET | Freq: Every day | ORAL | Status: DC
Start: 2014-06-20 — End: 2014-06-22
  Administered 2014-06-20 – 2014-06-21 (×2): 20 mg via ORAL
  Filled 2014-06-20 (×3): qty 1

## 2014-06-20 MED ORDER — MIDAZOLAM HCL 2 MG/2ML IJ SOLN
INTRAMUSCULAR | Status: AC
  Filled 2014-06-20: qty 2

## 2014-06-20 MED ORDER — ALUM & MAG HYDROXIDE-SIMETH 200-200-20 MG/5ML PO SUSP: 30.0000 mL | ORAL | Status: DC | PRN

## 2014-06-20 MED ORDER — ONDANSETRON HCL 4 MG/2ML IJ SOLN
INTRAMUSCULAR | Status: DC | PRN
  Administered 2014-06-20: 4 mg via INTRAVENOUS

## 2014-06-20 MED ORDER — BISACODYL 5 MG PO TBEC: 5.0000 mg | DELAYED_RELEASE_TABLET | Freq: Every day | ORAL | Status: DC | PRN

## 2014-06-20 MED ORDER — FENTANYL CITRATE 0.05 MG/ML IJ SOLN
INTRAMUSCULAR | Status: DC | PRN
  Administered 2014-06-20: 25 ug via INTRAVENOUS
  Administered 2014-06-20 (×2): 50 ug via INTRAVENOUS

## 2014-06-20 MED ORDER — FENTANYL CITRATE 0.05 MG/ML IJ SOLN
INTRAMUSCULAR | Status: AC
  Filled 2014-06-20: qty 5

## 2014-06-20 MED ORDER — METOCLOPRAMIDE HCL 10 MG PO TABS: 5.0000 mg | ORAL_TABLET | Freq: Three times a day (TID) | ORAL | Status: DC | PRN

## 2014-06-20 MED ORDER — INSULIN ASPART 100 UNIT/ML ~~LOC~~ SOLN
20.0000 [IU] | Freq: Two times a day (BID) | SUBCUTANEOUS | Status: DC
  Administered 2014-06-21 – 2014-06-22 (×2): 20 [IU] via SUBCUTANEOUS

## 2014-06-20 MED ORDER — OXYCODONE HCL 5 MG PO TABS
5.0000 mg | ORAL_TABLET | Freq: Once | ORAL | Status: AC | PRN
  Administered 2014-06-20: 5 mg via ORAL

## 2014-06-20 MED ORDER — HYDROMORPHONE HCL 1 MG/ML IJ SOLN: 0.5000 mg | INTRAMUSCULAR | Status: DC | PRN

## 2014-06-20 MED ORDER — TIMOLOL MALEATE 0.5 % OP SOLN
1.0000 [drp] | Freq: Every day | OPHTHALMIC | Status: DC
  Administered 2014-06-21 – 2014-06-22 (×2): 1 [drp] via OPHTHALMIC
  Filled 2014-06-20: qty 5

## 2014-06-20 MED ORDER — METHOCARBAMOL 500 MG PO TABS
500.0000 mg | ORAL_TABLET | Freq: Four times a day (QID) | ORAL | Status: DC | PRN
  Administered 2014-06-20 – 2014-06-22 (×7): 500 mg via ORAL
  Filled 2014-06-20 (×6): qty 1

## 2014-06-20 MED ORDER — HYDROCODONE-ACETAMINOPHEN 5-325 MG PO TABS
1.0000 | ORAL_TABLET | ORAL | Status: DC | PRN
  Administered 2014-06-20 – 2014-06-22 (×10): 2 via ORAL
  Filled 2014-06-20 (×10): qty 2

## 2014-06-20 MED ORDER — DIPHENHYDRAMINE HCL 12.5 MG/5ML PO ELIX: 12.5000 mg | ORAL_SOLUTION | ORAL | Status: DC | PRN

## 2014-06-20 MED ORDER — INSULIN ASPART 100 UNIT/ML ~~LOC~~ SOLN
0.0000 [IU] | Freq: Three times a day (TID) | SUBCUTANEOUS | Status: DC
  Administered 2014-06-21: 7 [IU] via SUBCUTANEOUS
  Administered 2014-06-21: 11 [IU] via SUBCUTANEOUS
  Administered 2014-06-21: 7 [IU] via SUBCUTANEOUS
  Administered 2014-06-22: 3 [IU] via SUBCUTANEOUS

## 2014-06-20 MED ORDER — SODIUM CHLORIDE 0.9 % IJ SOLN
INTRAMUSCULAR | Status: DC | PRN
  Administered 2014-06-20: 20 mL

## 2014-06-20 MED ORDER — ATORVASTATIN CALCIUM 80 MG PO TABS
80.0000 mg | ORAL_TABLET | Freq: Every day | ORAL | Status: DC
  Administered 2014-06-20 – 2014-06-21 (×2): 80 mg via ORAL
  Filled 2014-06-20 (×3): qty 1

## 2014-06-20 MED ORDER — PHENOL 1.4 % MT LIQD: 1.0000 | OROMUCOSAL | Status: DC | PRN

## 2014-06-20 MED ORDER — KCL-LACTATED RINGERS-D5W 20 MEQ/L IV SOLN
INTRAVENOUS | Status: DC
Start: 2014-06-20 — End: 2014-06-20

## 2014-06-20 MED ORDER — LIDOCAINE HCL (CARDIAC) 20 MG/ML IV SOLN
INTRAVENOUS | Status: DC | PRN
  Administered 2014-06-20: 20 mg via INTRAVENOUS

## 2014-06-20 MED ORDER — ACETAMINOPHEN 160 MG/5ML PO SOLN: 325.0000 mg | ORAL | Status: DC | PRN

## 2014-06-20 MED ORDER — INSULIN LISPRO 100 UNIT/ML ~~LOC~~ SOLN
20.0000 [IU] | Freq: Two times a day (BID) | SUBCUTANEOUS | Status: DC
Start: 2014-06-20 — End: 2014-06-20

## 2014-06-20 MED ORDER — SODIUM CHLORIDE 0.9 % IR SOLN
Status: DC | PRN
  Administered 2014-06-20: 1000 mL

## 2014-06-20 MED ORDER — INSULIN DETEMIR 100 UNIT/ML ~~LOC~~ SOLN
75.0000 [IU] | Freq: Two times a day (BID) | SUBCUTANEOUS | Status: DC
  Administered 2014-06-20 – 2014-06-22 (×4): 75 [IU] via SUBCUTANEOUS
  Filled 2014-06-20 (×5): qty 0.75

## 2014-06-20 MED ORDER — PROPOFOL INFUSION 10 MG/ML OPTIME
INTRAVENOUS | Status: DC | PRN
  Administered 2014-06-20: 75 ug/kg/min via INTRAVENOUS

## 2014-06-20 MED ORDER — CHLORHEXIDINE GLUCONATE 4 % EX LIQD: 60.0000 mL | Freq: Once | CUTANEOUS | Status: DC

## 2014-06-20 MED ORDER — LISINOPRIL 20 MG PO TABS
20.0000 mg | ORAL_TABLET | Freq: Every day | ORAL | Status: DC
  Administered 2014-06-20 – 2014-06-21 (×2): 20 mg via ORAL
  Filled 2014-06-20 (×3): qty 1

## 2014-06-20 MED ORDER — OXYCODONE HCL 5 MG PO TABS
ORAL_TABLET | ORAL | Status: AC
  Filled 2014-06-20: qty 1

## 2014-06-20 MED ORDER — OXYCODONE HCL 5 MG/5ML PO SOLN: 5.0000 mg | Freq: Once | ORAL | Status: AC | PRN

## 2014-06-20 MED ORDER — HYDROMORPHONE HCL 1 MG/ML IJ SOLN
0.2500 mg | INTRAMUSCULAR | Status: DC | PRN
  Administered 2014-06-20: 0.5 mg via INTRAVENOUS

## 2014-06-20 MED ORDER — ONDANSETRON HCL 4 MG PO TABS: 4.0000 mg | ORAL_TABLET | Freq: Four times a day (QID) | ORAL | Status: DC | PRN

## 2014-06-20 MED ORDER — BUPIVACAINE LIPOSOME 1.3 % IJ SUSP
20.0000 mL | Freq: Once | INTRAMUSCULAR | Status: AC
  Administered 2014-06-20: 20 mL
  Filled 2014-06-20: qty 20

## 2014-06-20 MED ORDER — ACETAMINOPHEN 325 MG PO TABS: 650.0000 mg | ORAL_TABLET | Freq: Four times a day (QID) | ORAL | Status: DC | PRN

## 2014-06-20 MED ORDER — PHENYLEPHRINE HCL 10 MG/ML IJ SOLN
INTRAMUSCULAR | Status: DC | PRN
  Administered 2014-06-20 (×3): 80 ug via INTRAVENOUS
  Administered 2014-06-20: 120 ug via INTRAVENOUS
  Administered 2014-06-20 (×4): 80 ug via INTRAVENOUS
  Administered 2014-06-20: 120 ug via INTRAVENOUS
  Administered 2014-06-20: 80 ug via INTRAVENOUS

## 2014-06-20 MED ORDER — DOCUSATE SODIUM 100 MG PO CAPS
100.0000 mg | ORAL_CAPSULE | Freq: Two times a day (BID) | ORAL | Status: DC
  Administered 2014-06-20 – 2014-06-22 (×4): 100 mg via ORAL
  Filled 2014-06-20 (×5): qty 1

## 2014-06-20 MED ORDER — LACTATED RINGERS IV SOLN
INTRAVENOUS | Status: DC | PRN
  Administered 2014-06-20 (×2): via INTRAVENOUS

## 2014-06-20 MED ORDER — METOCLOPRAMIDE HCL 5 MG/ML IJ SOLN: 5.0000 mg | Freq: Three times a day (TID) | INTRAMUSCULAR | Status: DC | PRN

## 2014-06-20 MED ORDER — HYDROMORPHONE HCL 1 MG/ML IJ SOLN
INTRAMUSCULAR | Status: AC
  Filled 2014-06-20: qty 1

## 2014-06-20 MED ORDER — ONDANSETRON HCL 4 MG/2ML IJ SOLN: 4.0000 mg | Freq: Four times a day (QID) | INTRAMUSCULAR | Status: DC | PRN

## 2014-06-20 MED ORDER — PROPOFOL 10 MG/ML IV BOLUS
INTRAVENOUS | Status: DC | PRN
  Administered 2014-06-20: 40 mg via INTRAVENOUS
  Administered 2014-06-20 (×2): 20 mg via INTRAVENOUS

## 2014-06-20 MED ORDER — METHOCARBAMOL 1000 MG/10ML IJ SOLN
500.0000 mg | Freq: Four times a day (QID) | INTRAMUSCULAR | Status: DC | PRN
  Filled 2014-06-20: qty 5

## 2014-06-20 MED ORDER — LEVOTHYROXINE SODIUM 75 MCG PO TABS
75.0000 ug | ORAL_TABLET | Freq: Every day | ORAL | Status: DC
  Administered 2014-06-21 – 2014-06-22 (×2): 75 ug via ORAL
  Filled 2014-06-20 (×3): qty 1

## 2014-06-20 MED ORDER — LACTATED RINGERS IV SOLN
INTRAVENOUS | Status: DC
  Administered 2014-06-20: 19:00:00 via INTRAVENOUS

## 2014-06-20 MED ORDER — METHOCARBAMOL 500 MG PO TABS
ORAL_TABLET | ORAL | Status: AC
  Filled 2014-06-20: qty 1

## 2014-06-20 MED ORDER — LACTATED RINGERS IV SOLN
INTRAVENOUS | Status: DC
  Administered 2014-06-20: 21:00:00 via INTRAVENOUS

## 2014-06-20 MED ORDER — VANCOMYCIN HCL 10 G IV SOLR
1500.0000 mg | INTRAVENOUS | Status: DC
  Filled 2014-06-20: qty 1500

## 2014-06-20 MED ORDER — SODIUM CHLORIDE 0.9 % IR SOLN
Status: DC | PRN
  Administered 2014-06-20: 3000 mL

## 2014-06-20 MED ORDER — MENTHOL 3 MG MT LOZG: 1.0000 | LOZENGE | OROMUCOSAL | Status: DC | PRN

## 2014-06-20 MED ORDER — DEXTROSE IN LACTATED RINGERS 5 % IV SOLN: INTRAVENOUS | Status: DC

## 2014-06-20 SURGICAL SUPPLY — 69 items
BAG DECANTER FOR FLEXI CONT (MISCELLANEOUS) ×3 IMPLANT
BANDAGE ELASTIC 4 VELCRO ST LF (GAUZE/BANDAGES/DRESSINGS) IMPLANT
BANDAGE ESMARK 6X9 LF (GAUZE/BANDAGES/DRESSINGS) ×1 IMPLANT
BENZOIN TINCTURE PRP APPL 2/3 (GAUZE/BANDAGES/DRESSINGS) ×3 IMPLANT
BLADE SAGITTAL 25.0X1.19X90 (BLADE) ×2 IMPLANT
BLADE SAGITTAL 25.0X1.19X90MM (BLADE) ×1
BLADE SAW SGTL 13.0X1.19X90.0M (BLADE) IMPLANT
BLADE SURG ROTATE 9660 (MISCELLANEOUS) IMPLANT
BNDG ELASTIC 6X10 VLCR STRL LF (GAUZE/BANDAGES/DRESSINGS) ×3 IMPLANT
BNDG ESMARK 6X9 LF (GAUZE/BANDAGES/DRESSINGS) ×3
BNDG GAUZE ELAST 4 BULKY (GAUZE/BANDAGES/DRESSINGS) ×6 IMPLANT
BOWL SMART MIX CTS (DISPOSABLE) ×3 IMPLANT
CAPT RP KNEE ×3 IMPLANT
CEMENT HV SMART SET (Cement) ×6 IMPLANT
CLOSURE STERI-STRIP 1/2X4 (GAUZE/BANDAGES/DRESSINGS) ×1
CLOSURE WOUND 1/2 X4 (GAUZE/BANDAGES/DRESSINGS)
CLSR STERI-STRIP ANTIMIC 1/2X4 (GAUZE/BANDAGES/DRESSINGS) ×2 IMPLANT
COVER SURGICAL LIGHT HANDLE (MISCELLANEOUS) ×3 IMPLANT
CUFF TOURNIQUET SINGLE 34IN LL (TOURNIQUET CUFF) ×3 IMPLANT
CUFF TOURNIQUET SINGLE 44IN (TOURNIQUET CUFF) IMPLANT
DRAPE EXTREMITY T 121X128X90 (DRAPE) ×3 IMPLANT
DRAPE IMP U-DRAPE 54X76 (DRAPES) ×3 IMPLANT
DRAPE PROXIMA HALF (DRAPES) ×3 IMPLANT
DRAPE U-SHAPE 47X51 STRL (DRAPES) ×3 IMPLANT
DRSG ADAPTIC 3X8 NADH LF (GAUZE/BANDAGES/DRESSINGS) ×3 IMPLANT
DRSG PAD ABDOMINAL 8X10 ST (GAUZE/BANDAGES/DRESSINGS) ×3 IMPLANT
DURAPREP 26ML APPLICATOR (WOUND CARE) ×3 IMPLANT
ELECT REM PT RETURN 9FT ADLT (ELECTROSURGICAL) ×3
ELECTRODE REM PT RTRN 9FT ADLT (ELECTROSURGICAL) ×1 IMPLANT
GAUZE SPONGE 4X4 12PLY STRL (GAUZE/BANDAGES/DRESSINGS) ×3 IMPLANT
GLOVE BIO SURGEON STRL SZ8 (GLOVE) ×6 IMPLANT
GLOVE BIOGEL PI IND STRL 8 (GLOVE) ×2 IMPLANT
GLOVE BIOGEL PI INDICATOR 8 (GLOVE) ×4
GOWN STRL REUS W/ TWL LRG LVL3 (GOWN DISPOSABLE) ×2 IMPLANT
GOWN STRL REUS W/ TWL XL LVL3 (GOWN DISPOSABLE) ×2 IMPLANT
GOWN STRL REUS W/TWL LRG LVL3 (GOWN DISPOSABLE) ×4
GOWN STRL REUS W/TWL XL LVL3 (GOWN DISPOSABLE) ×4
HANDPIECE INTERPULSE COAX TIP (DISPOSABLE) ×2
HOOD PEEL AWAY FACE SHEILD DIS (HOOD) ×9 IMPLANT
IMMOBILIZER KNEE 20 (SOFTGOODS) IMPLANT
IMMOBILIZER KNEE 22 UNIV (SOFTGOODS) ×3 IMPLANT
IMMOBILIZER KNEE 24 THIGH 36 (MISCELLANEOUS) IMPLANT
IMMOBILIZER KNEE 24 UNIV (MISCELLANEOUS)
KIT BASIN OR (CUSTOM PROCEDURE TRAY) ×3 IMPLANT
KIT ROOM TURNOVER OR (KITS) ×3 IMPLANT
MANIFOLD NEPTUNE II (INSTRUMENTS) ×3 IMPLANT
NEEDLE 18GX1X1/2 (RX/OR ONLY) (NEEDLE) ×3 IMPLANT
NEEDLE HYPO 21X1 ECLIPSE (NEEDLE) IMPLANT
NS IRRIG 1000ML POUR BTL (IV SOLUTION) ×3 IMPLANT
PACK TOTAL JOINT (CUSTOM PROCEDURE TRAY) ×3 IMPLANT
PACK UNIVERSAL I (CUSTOM PROCEDURE TRAY) IMPLANT
PAD ABD 8X10 STRL (GAUZE/BANDAGES/DRESSINGS) ×3 IMPLANT
PAD ARMBOARD 7.5X6 YLW CONV (MISCELLANEOUS) ×6 IMPLANT
SET HNDPC FAN SPRY TIP SCT (DISPOSABLE) ×1 IMPLANT
STAPLER VISISTAT 35W (STAPLE) IMPLANT
STRIP CLOSURE SKIN 1/2X4 (GAUZE/BANDAGES/DRESSINGS) IMPLANT
SUCTION FRAZIER TIP 10 FR DISP (SUCTIONS) ×3 IMPLANT
SUT MNCRL AB 3-0 PS2 18 (SUTURE) ×3 IMPLANT
SUT VIC AB 0 CT1 27 (SUTURE) ×4
SUT VIC AB 0 CT1 27XBRD ANBCTR (SUTURE) ×2 IMPLANT
SUT VIC AB 2-0 CT1 27 (SUTURE) ×4
SUT VIC AB 2-0 CT1 TAPERPNT 27 (SUTURE) ×2 IMPLANT
SUT VLOC 180 0 24IN GS25 (SUTURE) ×3 IMPLANT
SYR 50ML LL SCALE MARK (SYRINGE) ×3 IMPLANT
TOWEL OR 17X24 6PK STRL BLUE (TOWEL DISPOSABLE) ×3 IMPLANT
TOWEL OR 17X26 10 PK STRL BLUE (TOWEL DISPOSABLE) ×3 IMPLANT
TRAY CATH 16FR W/PLASTIC CATH (SET/KITS/TRAYS/PACK) ×3 IMPLANT
TRAY FOLEY CATH 14FR (SET/KITS/TRAYS/PACK) IMPLANT
WATER STERILE IRR 1000ML POUR (IV SOLUTION) IMPLANT

## 2014-06-20 NOTE — Interval H&P Note (Signed)
History and Physical Interval Note:  06/20/2014 1:05 PM  Melinda ScheuermannJanet E Fuhs  has presented today for surgery, with the diagnosis of LEFT KNEE DEGENETIVE JOINT DISEASE  The various methods of treatment have been discussed with the patient and family. After consideration of risks, benefits and other options for treatment, the patient has consented to  Procedure(s): LEFT TOTAL KNEE ARTHROPLASTY (Left) as a surgical intervention .  The patient's history has been reviewed, patient examined, no change in status, stable for surgery.  I have reviewed the patient's chart and labs.  Questions were answered to the patient's satisfaction.     [name]

## 2014-06-20 NOTE — Transfer of Care (Signed)
Immediate Anesthesia Transfer of Care Note  Patient: Melinda Holden  Procedure(s) Performed: Procedure(s): LEFT TOTAL KNEE ARTHROPLASTY (Left)  Patient Location: PACU  Anesthesia Type:MAC and Spinal  Level of Consciousness: awake, alert , oriented and patient cooperative  Airway & Oxygen Therapy: Patient Spontanous Breathing and Patient connected to nasal cannula oxygen  Post-op Assessment: Report given to PACU RN and Post -op Vital signs reviewed and stable  Post vital signs: Reviewed and stable  Complications: No apparent anesthesia complications

## 2014-06-20 NOTE — Anesthesia Postprocedure Evaluation (Signed)
  Anesthesia Post-op Note  Patient: Melinda Holden  Procedure(s) Performed: Procedure(s): LEFT TOTAL KNEE ARTHROPLASTY (Left)  Patient Location: PACU  Anesthesia Type:Spinal and MAC combined with regional for post-op pain  Level of Consciousness: awake, alert  and oriented  Airway and Oxygen Therapy: Patient Spontanous Breathing and Patient connected to nasal cannula oxygen  Post-op Pain: mild  Post-op Assessment: Post-op Vital signs reviewed, Patient's Cardiovascular Status Stable, Respiratory Function Stable, Patent Airway, No signs of Nausea or vomiting and Pain level controlled  Post-op Vital Signs: stable  Last Vitals:  Filed Vitals:   06/20/14 2000  BP:   Pulse:   Temp:   Resp: 17    Complications: No apparent anesthesia complications

## 2014-06-20 NOTE — Anesthesia Procedure Notes (Signed)
Spinal Patient location during procedure: OR Start time: 06/20/2014 2:15 PM End time: 06/20/2014 2:20 PM Staffing Anesthesiologist: Arta BruceSSEY, KEVIN Performed by: anesthesiologist  Preanesthetic Checklist Completed: patient identified, site marked, surgical consent, pre-op evaluation, timeout performed, IV checked, risks and benefits discussed and monitors and equipment checked Spinal Block Patient position: sitting Prep: Betadine Patient monitoring: heart rate, cardiac monitor, continuous pulse ox and blood pressure Approach: midline Location: L3-4 Injection technique: single-shot Needle Needle type: Pencan  Needle gauge: 24 G Needle length: 9 cm Assessment Sensory level: T10

## 2014-06-20 NOTE — Progress Notes (Signed)
Orthopedic Tech Progress Note Patient Details:  Melinda Holden 11/26/1955 [id number]  CPM Left Knee CPM Left Knee: On Left Knee Flexion (Degrees): 60 Left Knee Extension (Degrees): 0 Additional Comments: no overhead frames at this time   [name], Lourene Hoston Craig 06/20/2014, 5:36 PM

## 2014-06-20 NOTE — Op Note (Signed)
PREOP DIAGNOSIS: DJD LEFT KNEE POSTOP DIAGNOSIS:  same PROCEDURE: LEFT TKR ANESTHESIA: Spinal and MAC ATTENDING SURGEON: Yalitza Teed G ASSISTANT: Elodia FlorenceAndrew Nida PA  INDICATIONS FOR PROCEDURE: Melinda Holden is a 58 y.o. female who has struggled for a long time with pain due to degenerative arthritis of the left knee.  The patient has failed many conservative non-operative measures and at this point has pain which limits the ability to sleep and walk.  The patient is offered total knee replacement.  Informed operative consent was obtained after discussion of possible risks of anesthesia, infection, neurovascular injury, DVT, and death.  The importance of the post-operative rehabilitation protocol to optimize result was stressed extensively with the patient.  SUMMARY OF FINDINGS AND PROCEDURE:  Melinda Holden was taken to the operative suite where under the above anesthesia a left knee replacement was performed.  There were advanced degenerative changes and the bone quality was good.  We used the DePuyLCS system and placed size standard femur, 3 tibia, 35 mm all polyethylene patella, and a size 17.5 mm spacer.  Elodia FlorenceAndrew Nida PA-C assisted throughout and was invaluable to the completion of the case in that he helped retract and maintain exposure while I placed the components.  He also helped close thereby minimizing OR time.  The patient was admitted for appropriate post-op care to include perioperative antibiotics and mechanical and pharmacologic measures for DVT prophylaxis.  DESCRIPTION OF PROCEDURE:  Melinda Holden was taken to the operative suite where the above anesthesia was applied.  The patient was positioned supine and prepped and draped in normal sterile fashion.  An appropriate time out was performed.  After the administration of vancomycin pre-op antibiotic the leg was elevated and exsanguinated and a tourniquet inflated.  A standard longitudinal incision was made on the anterior knee.   Dissection was carried down to the extensor mechanism.  All appropriate anti-infective measures were used including the pre-operative antibiotic, betadine impregnated drape, and closed hooded exhaust systems for each member of the surgical team.  A medial parapatellar incision was made in the extensor mechanism and the knee cap flipped and the knee flexed.  Some residual meniscal tissues were removed along with any remaining ACL/PCL tissue.  A guide was placed on the tibia and a flat cut was made on it's superior surface.  An intramedullary guide was placed in the femur and was utilized to make anterior and posterior cuts creating an appropriate flexion gap.  A second intramedullary guide was placed in the femur to make a distal cut properly balancing the knee with an extension gap equal to the flexion gap.  The three bones sized to the above mentioned sizes and the appropriate guides were placed and utilized.  A trial reduction was done and the knee easily came to full extension and the patella tracked well on flexion.  The trial components were removed and all bones were cleaned with pulsatile lavage and then dried thoroughly.  Cement was mixed and was pressurized onto the bones followed by placement of the aforementioned components.  Excess cement was trimmed and pressure was held on the components until the cement had hardened.  The tourniquet was deflated and a small amount of bleeding was controlled with cautery and pressure.  The knee was irrigated thoroughly.  The extensor mechanism was re-approximated with V-loc suture in running fashion.  The knee was flexed and the repair was solid.  The subcutaneous tissues were re-approximated with #0 and #2-0 vicryl and the skin closed  with a subcuticular stitch and steristrips.  A sterile dressing was applied.  Intraoperative fluids, EBL, and tourniquet time can be obtained from anesthesia records.  DISPOSITION:  The patient was taken to recovery room in stable  condition and admitted for appropriate post-op care to include peri-operative antibiotic and DVT prophylaxis with mechanical and pharmacologic measures.  Keela Rubert G 06/20/2014, 4:04 PM

## 2014-06-20 NOTE — Anesthesia Preprocedure Evaluation (Addendum)
Anesthesia Evaluation  Patient identified by MRN, date of birth, ID band Patient awake    Reviewed: Allergy & Precautions, H&P , NPO status , Patient's Chart, lab work & pertinent test results  History of Anesthesia Complications Negative for: history of anesthetic complications  Airway Mallampati: II  TM Distance: >3 FB Neck ROM: Full    Dental  (+) Teeth Intact   Pulmonary neg pulmonary ROS,  breath sounds clear to auscultation        Cardiovascular hypertension, Pt. on medications - angina- Past MI and - CHF - dysrhythmias - Valvular Problems/MurmursRhythm:Regular     Neuro/Psych negative neurological ROS  negative psych ROS   GI/Hepatic Neg liver ROS, GERD-  Medicated and Controlled,  Endo/Other  diabetes, Type 2, Oral Hypoglycemic AgentsHypothyroidism   Renal/GU negative Renal ROS     Musculoskeletal  (+) Arthritis -, Osteoarthritis,    Abdominal   Peds  Hematology negative hematology ROS (+)   Anesthesia Other Findings   Reproductive/Obstetrics                           Anesthesia Physical Anesthesia Plan  ASA: III  Anesthesia Plan: MAC, Regional and Spinal   Post-op Pain Management:    Induction: Intravenous  Airway Management Planned: Simple Face Mask and Natural Airway  Additional Equipment: None  Intra-op Plan:   Post-operative Plan:   Informed Consent: I have reviewed the patients History and Physical, chart, labs and discussed the procedure including the risks, benefits and alternatives for the proposed anesthesia with the patient or authorized representative who has indicated his/her understanding and acceptance.   Dental advisory given  Plan Discussed with: CRNA and Surgeon  Anesthesia Plan Comments:        Anesthesia Quick Evaluation

## 2014-06-21 ENCOUNTER — Encounter (HOSPITAL_COMMUNITY): Payer: Self-pay | Admitting: Orthopaedic Surgery

## 2014-06-21 LAB — GLUCOSE, CAPILLARY
GLUCOSE-CAPILLARY: 231 mg/dL — AB (ref 70–99)
GLUCOSE-CAPILLARY: 195 mg/dL — AB (ref 70–99)
Glucose-Capillary: 248 mg/dL — ABNORMAL HIGH (ref 70–99)
Glucose-Capillary: 256 mg/dL — ABNORMAL HIGH (ref 70–99)

## 2014-06-21 LAB — CBC
HEMATOCRIT: 36.8 % (ref 36.0–46.0)
Hemoglobin: 12.1 g/dL (ref 12.0–15.0)
MCH: 28.1 pg (ref 26.0–34.0)
MCHC: 32.9 g/dL (ref 30.0–36.0)
MCV: 85.6 fL (ref 78.0–100.0)
PLATELETS: 250 10*3/uL (ref 150–400)
RBC: 4.3 MIL/uL (ref 3.87–5.11)
RDW: 13.2 % (ref 11.5–15.5)
WBC: 8 10*3/uL (ref 4.0–10.5)

## 2014-06-21 LAB — BASIC METABOLIC PANEL
Anion gap: 15 (ref 5–15)
BUN: 16 mg/dL (ref 6–23)
CHLORIDE: 99 meq/L (ref 96–112)
CO2: 22 mEq/L (ref 19–32)
CREATININE: 0.75 mg/dL (ref 0.50–1.10)
Calcium: 8.9 mg/dL (ref 8.4–10.5)
GFR calc Af Amer: >90 mL/min (ref >90)
Glucose, Bld: 248 mg/dL — ABNORMAL HIGH (ref 70–99)
POTASSIUM: 4.5 meq/L (ref 3.7–5.3)
Sodium: 136 mEq/L — ABNORMAL LOW (ref 137–147)

## 2014-06-21 NOTE — Progress Notes (Signed)
Inpatient Diabetes Program Recommendations  AACE/ADA: New Consensus Statement on Inpatient Glycemic Control (2013)  Target Ranges:  Prepandial:   less than 140 mg/dL      Peak postprandial:   less than 180 mg/dL (1-2 hours)      Critically ill patients:  140 - 180 mg/dL    Inpatient Diabetes Program Recommendations Insulin - Basal: Second dose levemir given this am-hopefully will help to normalize Insulin - Meal Coverage: pt has not gotten any meal coverage thus far. Glucose running in 200's. (? pt eating)  Pt flow sheet documents patient is eating 100-120  % yesterday and 60% today. Need meal coverage. Will follow. Thank you, Melinda CoffinAnn Kadin Bera, RN, CNS, Diabetes Coordinator [phone])

## 2014-06-21 NOTE — Progress Notes (Signed)
Orthopedic Tech Progress Note Patient Details:  Melinda Holden [birth year] [id number] On cpm at 7:25 pm increased 5 degrees. Patient ID: Melinda ScheuermannJanet E Holden, female   DOB: [birth year], 58 y.o.   MRN: [id number]   [name], [name] 06/21/2014, 7:24 PM

## 2014-06-21 NOTE — Plan of Care (Signed)
Problem: Phase I Progression Outcomes Goal: Initial discharge plan identified Outcome: Completed/Met Date Met:  06/21/14 Goal: Hemodynamically stable Outcome: Completed/Met Date Met:  06/21/14

## 2014-06-21 NOTE — Care Management Note (Signed)
CARE MANAGEMENT NOTE 06/21/2014  Patient:  Melinda Holden,Melinda Holden   Account Number:  [id number]  Date Initiated:  06/21/2014  Documentation initiated by:  Vance PeperBRADY,Shalissa Easterwood  Subjective/Objective Assessment:   58 yr old female admitted with DJD of left knee. Patient had a left total knee arthroplasty.     Action/Plan:   Case manager spoke with patient and husband concerning home health and DME needs at discharge. Patient preoperatively setup with [facility], no changes. Patient has RW, 3in1. Has family support at discharge.   Anticipated DC Date:  06/23/2014   Anticipated DC Plan:  HOME W HOME HEALTH SERVICES      DC Planning Services  CM consult      [facility]  HOME HEALTH  DURABLE MEDICAL EQUIPMENT   Choice offered to / List presented to:  C-1 Patient   DME arranged  CPM      DME agency  [employer]     HH arranged  HH-2 PT      HH agency  [facility]   Status of service:  Completed, signed off Medicare Important Message given?   (If response is "NO", the following Medicare IM given date fields will be blank) Date Medicare IM given:   Medicare IM given by:   Date Additional Medicare IM given:   Additional Medicare IM given by:    Discharge Disposition:  HOME W HOME HEALTH SERVICES  Per UR Regulation:  Reviewed for med. necessity/level of care/duration of stay  If discussed at Long Length of Stay Meetings, dates discussed:    Comments:  06/21/14 11:30AM Vance PeperSusan Marlaina Coburn, RN BSN Case Manager Case manager contacted [facility] Liaison, Tamera PuntMiranda, and informed her that patient is requesting same home health therapist that she had in July- Darnelle Devins. Also asked that she be sure to stress that patient wants home health to begin the day after her discharge.

## 2014-06-21 NOTE — Plan of Care (Signed)
Problem: Consults Goal: Total Joint Replacement Patient Education See Patient Education Module for education specifics. Outcome: Completed/Met Date Met:  06/21/14 Goal: Diagnosis- Total Joint Replacement Outcome: Completed/Met Date Met:  06/21/14 Primary Total Knee Left Goal: Skin Care Protocol Initiated - if Braden Score 18 or less If consults are not indicated, leave blank or document N/A Outcome: Completed/Met Date Met:  06/21/14 Goal: Nutrition Consult-if indicated Outcome: Not Applicable Date Met:  [birth year] Goal: Diabetes Guidelines if Diabetic/Glucose > 140 If diabetic or lab glucose is > 140 mg/dl - Initiate Diabetes/Hyperglycemia Guidelines & Document Interventions  Outcome: Completed/Met Date Met:  06/21/14

## 2014-06-21 NOTE — Progress Notes (Signed)
Subjective: 1 Day Post-Op Procedure(s) (LRB): LEFT TOTAL KNEE ARTHROPLASTY (Left)  Activity level:  wbat Diet tolerance:  okay Voiding:  okay Patient reports pain as mild.    Objective: Vital signs in last 24 hours: Temp:  [97.6 F (36.4 C)-98.7 F (37.1 C)] 97.8 F (36.6 C) (11/11 0607) Pulse Rate:  [88-109] 88 (11/11 0607) Resp:  [14-27] 17 (11/11 0607) BP: (102-159)/(40-98) 104/40 mmHg (11/11 0607) SpO2:  [94 %-100 %] 98 % (11/11 0607) Weight:  [112.447 kg (247 lb 14.4 oz)] 112.447 kg (247 lb 14.4 oz) (11/10 1125)  Labs:  Recent Labs  06/21/14 0540  HGB 12.1    Recent Labs  06/21/14 0540  WBC 8.0  RBC 4.30  HCT 36.8  PLT 250    Recent Labs  06/21/14 0540  NA 136*  K 4.5  CL 99  CO2 22  BUN 16  CREATININE 0.75  GLUCOSE 248*  CALCIUM 8.9    Recent Labs  06/20/14 1136  INR 1.03    Physical Exam:  Neurologically intact ABD soft Neurovascular intact Sensation intact distally Intact pulses distally Dorsiflexion/Plantar flexion intact Incision: scant drainage No cellulitis present Compartment soft  Assessment/Plan:  1 Day Post-Op Procedure(s) (LRB): LEFT TOTAL KNEE ARTHROPLASTY (Left) Advance diet Up with therapy D/C IV fluids Plan for discharge tomorrow Discharge home with home health if doing well and cleared by PT. Continue on ASA 325mg  BID x 2 weeks for DVT prevention. Follow-up in office 2 weeks post-op.     Felicia Bloomquist, Ginger OrganNDREW PAUL 06/21/2014, 7:40 AM

## 2014-06-21 NOTE — Progress Notes (Signed)
Occupational Therapy Evaluation Patient Details Name: Garnette ScheuermannJanet E Lariviere MRN: [id number] DOB: 01-Jun-1956 Today's Date: 06/21/2014    History of Present Illness Pt is a 58 y.o. 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 Pt with previous Rt TKA    Clinical Impression   Completed all education regarding compensatory techniques and use of AE for LB ADL and safe shower transfer techniques. Pt/husband verbalized understanding. Husband will be able to provide 24/7 S initially. OT signing off.     Follow Up Recommendations  No OT follow up;Supervision - Intermittent    Equipment Recommendations  None recommended by OT    Recommendations for Other Services       Precautions / Restrictions Precautions Precautions: Fall;Knee Precaution Comments: reviewed no pillow under knee Restrictions Weight Bearing Restrictions: Yes LLE Weight Bearing: Weight bearing as tolerated      Mobility Bed Mobility Overal bed mobility: Needs Assistance Bed Mobility: Supine to Sit     Supine to sit: Supervision;HOB elevated     General bed mobility comments: cues for sequencing; pt relying on handrails   Transfers Overall transfer level: Needs assistance Equipment used: Rolling walker (2 wheeled) Transfers: Sit to/from Stand Sit to Stand: Min assist - per PT         General transfer comment: min (A) to balance; cues for hand placement    Balance Overall balance assessment: Needs assistance Sitting-balance support: Feet supported;No upper extremity supported Sitting balance-Leahy Scale: Good     Standing balance support: During functional activity;Bilateral upper extremity supported Standing balance-Leahy Scale: Poor Standing balance comment: RW to balance                             ADL                                         General ADL Comments: Husband plans to assist with ADL as needed. Pt has AE and was able to verbalize ability to use AE. Educated pt/husband on  safe shower transfer techniqu by stepping backwards over threshold with strong nonoperated leg first. Pt/husband verbalized understanding.      Vision                     Perception     Praxis      Pertinent Vitals/Pain Pain Assessment: 0-10 Pain Score: 6  Pain Location: L knee Pain Descriptors / Indicators: Aching Pain Intervention(s): Limited activity within patient's tolerance     Hand Dominance Right   Extremity/Trunk Assessment Upper Extremity Assessment Upper Extremity Assessment: Overall WFL for tasks assessed   Lower Extremity Assessment Lower Extremity Assessment: LLE deficits/detail LLE Deficits / Details: -2 to 50 degrees AROM in sitting; limited by pain and ACE wrap  LLE: Unable to fully assess due to pain;Unable to fully assess due to immobilization LLE Coordination: decreased gross motor   Cervical / Trunk Assessment Cervical / Trunk Assessment: Normal   Communication Communication Communication: No difficulties   Cognition Arousal/Alertness: Awake/alert Behavior During Therapy: WFL for tasks assessed/performed Overall Cognitive Status: Within Functional Limits for tasks assessed                     General Comments       Exercises Exercises: Total Joint     Shoulder Instructions      Home Living Family/patient expects  to be discharged to:: Private residence Living Arrangements: Spouse/significant other Available Help at Discharge: Family;Available 24 hours/day Type of Home: Apartment Home Access: Elevator     Home Layout: One level     Bathroom Shower/Tub: Producer, television/film/videoWalk-in shower   Bathroom Toilet: Standard Bathroom Accessibility: Yes How Accessible: Accessible via walker Home Equipment: Walker - 2 wheels;Bedside commode;Adaptive equipment Adaptive Equipment: Reacher;Sock aid;Long-handled shoe horn;Other (Comment) Additional Comments: equipment from previous Rt TKA      Prior Functioning/Environment Level of Independence:  Independent        Comments: works from home    OT Diagnosis: Generalized weakness;Acute pain   OT Problem List:     OT Treatment/Interventions:      OT Goals(Current goals can be found in the care plan section) Acute Rehab OT Goals Patient Stated Goal: to go home by friday OT Goal Formulation:  (eval only)  OT Frequency:     Barriers to D/C:            Co-evaluation              End of Session CPM Left Knee CPM Left Knee: Off  Activity Tolerance:   Patient left:     Time: 0865-78461155-1205 OT Time Calculation (min): 10 min Charges:  OT General Charges $OT Visit: 1 Procedure OT Evaluation $Initial OT Evaluation Tier I: 1 Procedure G-Codes:    Yuriel Lopezmartinez,HILLARY 06/21/2014, 12:23 PM   Luisa DagoHilary Nickisha Hum, OTR/L  [phone] 06/21/2014

## 2014-06-21 NOTE — Progress Notes (Signed)
Physical Therapy Treatment Patient Details Name: Melinda Holden MRN: [id number] DOB: April 30, 1956 Today's Date: 06/21/2014    History of Present Illness Pt is a 58 y.o. 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 Pt with previous Rt TKA     PT Comments    Pt very emotional initially due to pain control issues. Pt agreeable to ambulate and progress with therapy. Requires multiple standing rest breaks due to UE fatigue with ambulating. Will cont to follow per POC.   Follow Up Recommendations  Home health PT;Supervision/Assistance - 24 hour     Equipment Recommendations  None recommended by PT    Recommendations for Other Services       Precautions / Restrictions Precautions Precautions: Fall;Knee Precaution Comments: reviewed no pillow under knee Required Braces or Orthoses: Knee Immobilizer - Left Restrictions Weight Bearing Restrictions: Yes LLE Weight Bearing: Weight bearing as tolerated    Mobility  Bed Mobility Overal bed mobility: Needs Assistance Bed Mobility: Supine to Sit     Supine to sit: Supervision;HOB elevated     General bed mobility comments: pt sitting on EOB   Transfers Overall transfer level: Needs assistance Equipment used: Rolling walker (2 wheeled) Transfers: Pharmacologisttand Pivot Transfers;Sit to/from Stand Sit to Stand: Min guard Stand pivot transfers: Min guard       General transfer comment: [facility]; cues for hand placement and safety  Ambulation/Gait Ambulation/Gait assistance: Min guard Ambulation Distance (Feet): 80 Feet Assistive device: Rolling walker (2 wheeled) Gait Pattern/deviations: Step-through pattern;Decreased stride length;Wide base of support Gait velocity: decreased Gait velocity interpretation: Below normal speed for age/gender General Gait Details: cues for step through gt and equal stride lengths; required multiple standing rest breaks due to UE fatigue    Stairs            Wheelchair Mobility     Modified Rankin (Stroke Patients Only)       Balance Overall balance assessment: Needs assistance Sitting-balance support: Feet supported;No upper extremity supported Sitting balance-Leahy Scale: Good     Standing balance support: During functional activity;Bilateral upper extremity supported Standing balance-Leahy Scale: Poor Standing balance comment: relies on RW for balance                     Cognition Arousal/Alertness: Awake/alert Behavior During Therapy: WFL for tasks assessed/performed Overall Cognitive Status: Within Functional Limits for tasks assessed                      Exercises Total Joint Exercises Ankle Circles/Pumps: AROM;Both;10 reps;Supine Quad Sets: AROM;Strengthening;Left;10 reps;Seated Short Arc Quad: AROM;Left;Strengthening;10 reps;Seated Heel Slides: AROM;Left;10 reps;Seated Hip ABduction/ADduction: AAROM;Strengthening;Left;10 reps Long Arc Quad: AROM;Strengthening;Left;Seated;10 reps    General Comments        Pertinent Vitals/Pain Pain Assessment: 0-10 Pain Score: 6  Pain Location: Lt knee Pain Descriptors / Indicators: Burning Pain Intervention(s): Monitored during session;Premedicated before session;Repositioned;Ice applied    Home Living Family/patient expects to be discharged to:: Private residence Living Arrangements: Spouse/significant other Available Help at Discharge: Family;Available 24 hours/day Type of Home: Apartment Home Access: Elevator   Home Layout: One level Home Equipment: Walker - 2 wheels;Bedside commode;Adaptive equipment Additional Comments: equipment from previous Rt TKA    Prior Function Level of Independence: Independent      Comments: works from home   PT Goals (current goals can now be found in the care plan section) Acute Rehab PT Goals Patient Stated Goal: to go home by friday PT Goal Formulation: With patient  Time For Goal Achievement: 06/25/14 Potential to Achieve Goals:  Good Progress towards PT goals: Progressing toward goals    Frequency  7X/week    PT Plan Current plan remains appropriate    Co-evaluation             End of Session Equipment Utilized During Treatment: Gait belt;Left knee immobilizer Activity Tolerance: Patient tolerated treatment well Patient left: in chair;with call bell/phone within reach;with family/visitor present     Time: [id number] PT Time Calculation (min) (ACUTE ONLY): 28 min  Charges:  $Gait Training: 8-22 mins $Therapeutic Exercise: 8-22 mins                    G CodesDonnamarie Poag:      Rydan Gulyas [place], [place]  [phone] 06/21/2014, 2:06 PM

## 2014-06-21 NOTE — Evaluation (Signed)
Physical Therapy Evaluation Patient Details Name: Melinda Holden MRN: [id number] DOB: Aug 03, 1956 Today's Date: 06/21/2014   History of Present Illness  Pt is a 58 y.o. 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 Pt with previous Rt TKA   Clinical Impression  Pt is s/p LT TKA resulting in the deficits listed below (see PT Problem List).  Pt will benefit from skilled PT to increase their independence and safety with mobility to allow discharge to the venue listed below. Pt c/o 8/10 "soreness" in Lt thigh, limiting mobility this session. anticipate good progress when pain is controlled.     Follow Up Recommendations Home health PT;Supervision/Assistance - 24 hour    Equipment Recommendations  None recommended by PT    Recommendations for Other Services OT consult     Precautions / Restrictions Precautions Precautions: Fall;Knee Precaution Comments: reviewed no pillow under knee Restrictions Weight Bearing Restrictions: Yes LLE Weight Bearing: Weight bearing as tolerated      Mobility  Bed Mobility Overal bed mobility: Needs Assistance Bed Mobility: Supine to Sit     Supine to sit: Supervision;HOB elevated     General bed mobility comments: cues for sequencing; pt relying on handrails   Transfers Overall transfer level: Needs assistance Equipment used: Rolling walker (2 wheeled) Transfers: Sit to/from Stand Sit to Stand: Min assist         General transfer comment: min (A) to balance; cues for hand placement  Ambulation/Gait Ambulation/Gait assistance: Min assist Ambulation Distance (Feet): 16 Feet Assistive device: Rolling walker (2 wheeled) Gait Pattern/deviations: Step-to pattern;Decreased stance time - left;Decreased step length - right;Antalgic;Wide base of support;Trunk flexed Gait velocity: decreased Gait velocity interpretation: Below normal speed for age/gender General Gait Details: cues for step through gt and upright posture; min (A) to manage RW and  balance  Stairs            Wheelchair Mobility    Modified Rankin (Stroke Patients Only)       Balance Overall balance assessment: Needs assistance Sitting-balance support: Feet supported;No upper extremity supported Sitting balance-Leahy Scale: Good     Standing balance support: During functional activity;Bilateral upper extremity supported Standing balance-Leahy Scale: Poor Standing balance comment: RW to balance                              Pertinent Vitals/Pain Pain Assessment: 0-10 Pain Score: 8  Pain Location: Lt thigh Pain Descriptors / Indicators: Sore Pain Intervention(s): Monitored during session;Limited activity within patient's tolerance;Repositioned;Premedicated before session;Ice applied    Home Living Family/patient expects to be discharged to:: Private residence Living Arrangements: Spouse/significant other Available Help at Discharge: Family;Available 24 hours/day Type of Home: Apartment Home Access: Elevator     Home Layout: One level Home Equipment: Walker - 2 wheels;Bedside commode;Adaptive equipment Additional Comments: equipment from previous Rt TKA    Prior Function Level of Independence: Independent         Comments: works from home     Hand Dominance   Dominant Hand: Right    Extremity/Trunk Assessment   Upper Extremity Assessment: Defer to OT evaluation           Lower Extremity Assessment: LLE deficits/detail   LLE Deficits / Details: -2 to 50 degrees AROM in sitting; limited by pain and ACE wrap   Cervical / Trunk Assessment: Normal  Communication   Communication: No difficulties  Cognition Arousal/Alertness: Awake/alert Behavior During Therapy: WFL for tasks assessed/performed Overall Cognitive Status: Within Functional  Limits for tasks assessed                      General Comments      Exercises Total Joint Exercises Ankle Circles/Pumps: AROM;Both;10 reps;Supine Quad Sets:  AROM;Strengthening;Left;10 reps;Seated Hip ABduction/ADduction: AAROM;Strengthening;Left;10 reps Long Arc Quad: AROM;Strengthening;Left;5 reps;Limitations;Seated Long Arc Quad Limitations: pain Goniometric ROM: see Lt LE assessment above      Assessment/Plan    PT Assessment Patient needs continued PT services  PT Diagnosis Difficulty walking;Generalized weakness;Acute pain   PT Problem List Decreased strength;Decreased range of motion;Decreased balance;Decreased activity tolerance;Decreased mobility;Decreased knowledge of use of DME;Decreased safety awareness;Pain  PT Treatment Interventions DME instruction;Gait training;Functional mobility training;Therapeutic activities;Neuromuscular re-education;Balance training;Therapeutic exercise;Patient/family education   PT Goals (Current goals can be found in the Care Plan section) Acute Rehab PT Goals Patient Stated Goal: to go home by friday PT Goal Formulation: With patient Time For Goal Achievement: 06/25/14 Potential to Achieve Goals: Good    Frequency 7X/week   Barriers to discharge        Co-evaluation               End of Session Equipment Utilized During Treatment: Gait belt;Left knee immobilizer Activity Tolerance: Patient tolerated treatment well Patient left: in chair;with call bell/phone within reach Nurse Communication: Mobility status         Time: [id number] PT Time Calculation (min) (ACUTE ONLY): 20 min   Charges:   PT Evaluation $Initial PT Evaluation Tier I: 1 Procedure PT Treatments $Gait Training: 8-22 mins   PT G CodesDonell Holden:          Melinda Holden, [place]  [phone] 06/21/2014, 9:00 AM

## 2014-06-21 NOTE — Plan of Care (Signed)
Problem: Phase I Progression Outcomes Goal: CMS/Neurovascular status WDL Outcome: Completed/Met Date Met:  06/21/14 Goal: Pain controlled with appropriate interventions Outcome: Completed/Met Date Met:  06/21/14 Goal: Dangle or out of bed evening of surgery Outcome: Completed/Met Date Met:  06/21/14  Problem: Phase II Progression Outcomes Goal: Ambulates Outcome: Completed/Met Date Met:  06/21/14 Goal: Tolerating diet Outcome: Completed/Met Date Met:  06/21/14 Goal: Discharge plan established Outcome: Progressing     

## 2014-06-22 LAB — CBC
HEMATOCRIT: 32.9 % — AB (ref 36.0–46.0)
Hemoglobin: 10.7 g/dL — ABNORMAL LOW (ref 12.0–15.0)
MCH: 28.5 pg (ref 26.0–34.0)
MCHC: 32.5 g/dL (ref 30.0–36.0)
MCV: 87.5 fL (ref 78.0–100.0)
Platelets: 216 10*3/uL (ref 150–400)
RBC: 3.76 MIL/uL — AB (ref 3.87–5.11)
RDW: 13.1 % (ref 11.5–15.5)
WBC: 7 10*3/uL (ref 4.0–10.5)

## 2014-06-22 LAB — GLUCOSE, CAPILLARY
GLUCOSE-CAPILLARY: 140 mg/dL — AB (ref 70–99)
Glucose-Capillary: 113 mg/dL — ABNORMAL HIGH (ref 70–99)

## 2014-06-22 MED ORDER — METHOCARBAMOL 1000 MG/10ML IJ SOLN: 500.0000 mg | Freq: Four times a day (QID) | INTRAVENOUS | Status: AC | PRN

## 2014-06-22 MED ORDER — ASPIRIN 325 MG PO TBEC: 325.0000 mg | DELAYED_RELEASE_TABLET | Freq: Two times a day (BID) | ORAL | Status: AC

## 2014-06-22 MED ORDER — HYDROCODONE-ACETAMINOPHEN 5-325 MG PO TABS: 1.0000 | ORAL_TABLET | ORAL | Status: DC | PRN

## 2014-06-22 MED ORDER — ASPIRIN 325 MG PO TBEC: 325.0000 mg | DELAYED_RELEASE_TABLET | Freq: Two times a day (BID) | ORAL | Status: DC

## 2014-06-22 MED ORDER — METHOCARBAMOL 500 MG PO TABS: 500.0000 mg | ORAL_TABLET | Freq: Four times a day (QID) | ORAL | Status: AC | PRN

## 2014-06-22 NOTE — Plan of Care (Signed)
Problem: Phase I Progression Outcomes Goal: Other Phase I Outcomes/Goals Outcome: Completed/Met Date Met:  06/22/14  Problem: Phase II Progression Outcomes Goal: Discharge plan established Outcome: Completed/Met Date Met:  06/22/14 Goal: Other Phase II Outcomes/Goals Outcome: Completed/Met Date Met:  06/22/14  Problem: Phase III Progression Outcomes Goal: Pain controlled on oral analgesia Outcome: Completed/Met Date Met:  06/22/14 Goal: Ambulates Outcome: Completed/Met Date Met:  06/22/14 Goal: Incision clean - minimal/no drainage Outcome: Completed/Met Date Met:  06/22/14 Goal: Discharge plan remains appropriate-arrangements made Outcome: Completed/Met Date Met:  06/22/14 Goal: Anticoagulant follow-up in place Outcome: Adequate for Discharge Goal: Other Phase III Outcomes/Goals Outcome: Completed/Met Date Met:  06/22/14  Problem: Discharge Progression Outcomes Goal: Barriers To Progression Addressed/Resolved Outcome: Completed/Met Date Met:  06/22/14 Goal: CMS/Neurovascular status at or above baseline Outcome: Completed/Met Date Met:  06/22/14 Goal: Anticoagulant follow-up in place Outcome: Adequate for Discharge Goal: Pain controlled with appropriate interventions Outcome: Completed/Met Date Met:  06/22/14 Goal: Hemodynamically stable Outcome: Completed/Met Date Met:  24/11/46 Goal: Complications resolved/controlled Outcome: Completed/Met Date Met:  06/22/14 Goal: Tolerates diet Outcome: Completed/Met Date Met:  06/22/14 Goal: Activity appropriate for discharge plan Outcome: Completed/Met Date Met:  06/22/14 Goal: Ambulates safely using assistive device Outcome: Completed/Met Date Met:  06/22/14 Goal: Follows weight - bearing limitations Outcome: Completed/Met Date Met:  06/22/14 Goal: Discharge plan in place and appropriate Outcome: Completed/Met Date Met:  06/22/14 Goal: Negotiates stairs Outcome: Adequate for Discharge Goal: Demonstrates ADLs as  appropriate Outcome: Completed/Met Date Met:  06/22/14 Goal: Incision without S/S infection Outcome: Completed/Met Date Met:  06/22/14 Goal: Other Discharge Outcomes/Goals Outcome: Completed/Met Date Met:  06/22/14

## 2014-06-22 NOTE — Progress Notes (Signed)
Physical Therapy Treatment Patient Details Name: Melinda ScheuermannJanet E Damore MRN: [id number] DOB: 01-25-1956 Today's Date: 06/22/2014    History of Present Illness Pt is a 58 y.o. 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 Pt with previous Rt TKA     PT Comments    Pt progressing well with therapy. Reviewed mobility technique and HEP. Will follow per POC until D/C. Pt hopeful to D/C this afternoon.   Follow Up Recommendations  Home health PT;Supervision/Assistance - 24 hour     Equipment Recommendations  None recommended by PT    Recommendations for Other Services       Precautions / Restrictions Precautions Precautions: Knee Precaution Comments: reviewed knee precautions  Knee Immobilizer - Left: Other (comment) (no buckling when ambulating withotu today) Restrictions Weight Bearing Restrictions: Yes LLE Weight Bearing: Weight bearing as tolerated    Mobility  Bed Mobility Overal bed mobility: Needs Assistance Bed Mobility: Supine to Sit     Supine to sit: Supervision     General bed mobility comments: pt uses leg lift to (A) Lt LE off EOB  Transfers Overall transfer level: Needs assistance Equipment used: Rolling walker (2 wheeled) Transfers: Sit to/from Stand Sit to Stand: Min guard         General transfer comment: supervision for cues for safety  Ambulation/Gait Ambulation/Gait assistance: Supervision Ambulation Distance (Feet): 130 Feet Assistive device: Rolling walker (2 wheeled) Gait Pattern/deviations: Step-through pattern;Decreased stance time - left;Decreased step length - right;Antalgic;Wide base of support;Trunk flexed Gait velocity: decreased Gait velocity interpretation: Below normal speed for age/gender General Gait Details: cues for technique and upright posture; pt progressing weel but c/o UE fatigue and require standing rest break   Stairs            Wheelchair Mobility    Modified Rankin (Stroke Patients Only)       Balance Overall balance  assessment: Needs assistance Sitting-balance support: Feet supported;No upper extremity supported Sitting balance-Leahy Scale: Good     Standing balance support: During functional activity;Bilateral upper extremity supported Standing balance-Leahy Scale: Poor Standing balance comment: RW for balance                     Cognition Arousal/Alertness: Awake/alert Behavior During Therapy: WFL for tasks assessed/performed Overall Cognitive Status: Within Functional Limits for tasks assessed                      Exercises Total Joint Exercises Ankle Circles/Pumps: AROM;Both;10 reps;Supine Quad Sets: AROM;Strengthening;Left;10 reps;Seated Short Arc Quad: AROM;Left;Strengthening;10 reps;Seated Heel Slides: AAROM;Left;5 reps;Other (comment) (with use of her Leg lifter) Hip ABduction/ADduction: AAROM;Strengthening;Left;10 reps (with use of her Leg lifter) Long Arc Quad:  (reviewed technique ) Goniometric ROM: AROM in sitting 60    General Comments General comments (skin integrity, edema, etc.): given HEP handout       Pertinent Vitals/Pain Pain Assessment: 0-10 Pain Score: 4  Pain Location: Lt knee Pain Descriptors / Indicators: Sore Pain Intervention(s): Monitored during session;Premedicated before session;Repositioned    Home Living                      Prior Function            PT Goals (current goals can now be found in the care plan section) Acute Rehab PT Goals Patient Stated Goal: to go home today PT Goal Formulation: With patient Time For Goal Achievement: 06/25/14 Potential to Achieve Goals: Good Progress towards PT goals: Progressing toward goals  Frequency  7X/week    PT Plan Current plan remains appropriate    Co-evaluation             End of Session Equipment Utilized During Treatment: Gait belt;Left knee immobilizer Activity Tolerance: Patient tolerated treatment well Patient left: in chair;with call bell/phone within  reach;with family/visitor present     Time: [id number] PT Time Calculation (min) (ACUTE ONLY): 24 min  Charges:  $Gait Training: 8-22 mins $Therapeutic Exercise: 8-22 mins                    G CodesDonell Sievert:      Anelle Parlow N, [place]  [phone] 06/22/2014, 9:16 AM

## 2014-06-22 NOTE — Discharge Summary (Signed)
Patient ID: Melinda Holden MRN: [id number] DOB/AGE: 1956-05-23 58 y.o.  Admit date: 06/20/2014 Discharge date: 06/22/2014  Admission Diagnoses:  Principal Problem:   Primary osteoarthritis of left knee Active Problems:   Morbid obesity   Diabetes   Discharge Diagnoses:  Same  Past Medical History  Diagnosis Date  . Hypertension   . Shortness of breath     exertion  . Hypothyroidism   . Diabetes mellitus without complication   . GERD (gastroesophageal reflux disease)   . Arthritis     Surgeries: Procedure(s): LEFT TOTAL KNEE ARTHROPLASTY on 06/20/2014   Consultants:    Discharged Condition: Improved  Hospital Course: Melinda ScheuermannJanet E Holden is an 58 y.o. female who was admitted 06/20/2014 for operative treatment ofPrimary osteoarthritis of left knee. Patient has severe unremitting pain that affects sleep, daily activities, and work/hobbies. After pre-op clearance the patient was taken to the operating room on 06/20/2014 and underwent  Procedure(s): LEFT TOTAL KNEE ARTHROPLASTY.    Patient was given perioperative antibiotics: Anti-infectives    Start     Dose/Rate Route Frequency Ordered Stop   06/21/14 0230  vancomycin (VANCOCIN) 1,500 mg in sodium chloride 0.9 % 500 mL IVPB     1,500 mg250 mL/hr over 120 Minutes Intravenous Every 12 hours 06/20/14 1952 06/21/14 0500   06/20/14 1125  vancomycin (VANCOCIN) 1,500 mg in sodium chloride 0.9 % 500 mL IVPB  Status:  Discontinued     1,500 mg250 mL/hr over 120 Minutes Intravenous On call to O.R. 06/20/14 1125 06/20/14 1938   06/20/14 0600  vancomycin (VANCOCIN) 1,500 mg in sodium chloride 0.9 % 500 mL IVPB     1,500 mg250 mL/hr over 120 Minutes Intravenous On call to O.R. 06/19/14 1238 06/20/14 1415       Patient was given sequential compression devices, early ambulation, and chemoprophylaxis to prevent DVT.  Patient benefited maximally from hospital stay and there were no complications.    Recent vital signs: Patient  Vitals for the past 24 hrs:  BP Temp Temp src Pulse Resp SpO2  06/22/14 0800 - - - - 16 -  06/22/14 0650 (!) 132/56 mmHg 98.1 F (36.7 C) - 100 18 98 %  06/22/14 0400 - - - - 18 98 %  06/22/14 0000 - - - - 18 97 %  06/21/14 2130 (!) 128/55 mmHg 98.2 F (36.8 C) - (!) 102 18 98 %  06/21/14 2000 - - - - 18 99 %  06/21/14 1600 - - - - 18 98 %  06/21/14 1550 (!) 134/55 mmHg 98 F (36.7 C) Oral 100 18 98 %     Recent laboratory studies:  Recent Labs  06/20/14 1136 06/21/14 0540 06/22/14 0535  WBC  --  8.0 7.0  HGB  --  12.1 10.7*  HCT  --  36.8 32.9*  PLT  --  250 216  NA  --  136*  --   K  --  4.5  --   CL  --  99  --   CO2  --  22  --   BUN  --  16  --   CREATININE  --  0.75  --   GLUCOSE  --  248*  --   INR 1.03  --   --   CALCIUM  --  8.9  --      Discharge Medications:     Medication List    STOP taking these medications        ibuprofen 200  MG tablet  Commonly known as:  ADVIL,MOTRIN     insulin lispro 100 UNIT/ML injection  Commonly known as:  HUMALOG      TAKE these medications        aspirin 325 MG EC tablet  Take 1 tablet (325 mg total) by mouth 2 (two) times daily after a meal.     atorvastatin 80 MG tablet  Commonly known as:  LIPITOR  Take 80 mg by mouth at bedtime.     famotidine 20 MG tablet  Commonly known as:  PEPCID  Take 20 mg by mouth at bedtime.     HYDROcodone-acetaminophen 5-325 MG per tablet  Commonly known as:  NORCO/VICODIN  Take 1-2 tablets by mouth every 4 (four) hours as needed (breakthrough pain).     insulin detemir 100 UNIT/ML injection  Commonly known as:  LEVEMIR  Inject 75 Units into the skin 2 (two) times daily.     ISTALOL 0.5 % (DAILY) Soln  Generic drug:  Timolol Maleate  Place 1 drop into both eyes daily.     levothyroxine 75 MCG tablet  Commonly known as:  SYNTHROID, LEVOTHROID  Take 75 mcg by mouth daily before breakfast.     lisinopril 20 MG tablet  Commonly known as:  PRINIVIL,ZESTRIL  Take 20 mg  by mouth at bedtime.     metFORMIN 500 MG 24 hr tablet  Commonly known as:  GLUCOPHAGE-XR  Take 1,000 mg by mouth 2 (two) times daily.     methocarbamol 500 MG tablet  Commonly known as:  ROBAXIN  Take 1 tablet (500 mg total) by mouth every 6 (six) hours as needed for muscle spasms.        Diagnostic Studies: No results found.  Disposition: 06-Home-Health Care Svc      Discharge Instructions    Call MD / Call 911    Complete by:  As directed   If you experience chest pain or shortness of breath, CALL 911 and be transported to the hospital emergency room.  If you develope a fever above 101 F, pus (white drainage) or increased drainage or redness at the wound, or calf pain, call your surgeon's office.     Constipation Prevention    Complete by:  As directed   Drink plenty of fluids.  Prune juice may be helpful.  You may use a stool softener, such as Colace (over the counter) 100 mg twice a day.  Use MiraLax (over the counter) for constipation as needed.     Diet - low sodium heart healthy    Complete by:  As directed      Increase activity slowly as tolerated    Complete by:  As directed            Follow-up Information    Follow up with Velna OchsALLDORF,PETER G, MD. Call in 2 weeks.   Specialty:  Orthopedic Surgery   Contact information:   [address] [place] [address] [phone]       Please follow up.   Why:  Someone from [facility] will contact you concerning start date and time for physical therapy.       Signed: Drema HalonIDA, Paulette Rockford PAUL 06/22/2014, 1:52 PM

## 2014-06-23 LAB — URINE CULTURE: Colony Count: 10000

## 2014-09-20 ENCOUNTER — Other Ambulatory Visit (HOSPITAL_COMMUNITY)
Admission: RE | Admit: 2014-09-20 | Discharge: 2014-09-20 | Disposition: A | Discharge status: Home / Self Care | Payer: 59 | Source: Ambulatory Visit | Attending: Internal Medicine | Admitting: Internal Medicine

## 2014-09-20 DIAGNOSIS — Z01419 Encounter for gynecological examination (general) (routine) without abnormal findings: Secondary | ICD-10-CM | POA: Insufficient documentation

## 2016-08-18 DIAGNOSIS — E1136 Type 2 diabetes mellitus with diabetic cataract: Secondary | ICD-10-CM | POA: Diagnosis not present

## 2016-08-18 DIAGNOSIS — E113212 Type 2 diabetes mellitus with mild nonproliferative diabetic retinopathy with macular edema, left eye: Secondary | ICD-10-CM | POA: Diagnosis not present

## 2016-08-18 DIAGNOSIS — H43812 Vitreous degeneration, left eye: Secondary | ICD-10-CM | POA: Diagnosis not present

## 2016-08-26 DIAGNOSIS — E113212 Type 2 diabetes mellitus with mild nonproliferative diabetic retinopathy with macular edema, left eye: Secondary | ICD-10-CM | POA: Diagnosis not present

## 2016-09-09 DIAGNOSIS — E11311 Type 2 diabetes mellitus with unspecified diabetic retinopathy with macular edema: Secondary | ICD-10-CM | POA: Diagnosis not present

## 2016-09-09 DIAGNOSIS — H25812 Combined forms of age-related cataract, left eye: Secondary | ICD-10-CM | POA: Diagnosis not present

## 2016-09-09 DIAGNOSIS — H268 Other specified cataract: Secondary | ICD-10-CM | POA: Diagnosis not present

## 2016-09-16 DIAGNOSIS — E1122 Type 2 diabetes mellitus with diabetic chronic kidney disease: Secondary | ICD-10-CM | POA: Diagnosis not present

## 2016-09-16 DIAGNOSIS — I129 Hypertensive chronic kidney disease with stage 1 through stage 4 chronic kidney disease, or unspecified chronic kidney disease: Secondary | ICD-10-CM | POA: Diagnosis not present

## 2016-09-24 DIAGNOSIS — N182 Chronic kidney disease, stage 2 (mild): Secondary | ICD-10-CM | POA: Diagnosis not present

## 2016-09-24 DIAGNOSIS — E1122 Type 2 diabetes mellitus with diabetic chronic kidney disease: Secondary | ICD-10-CM | POA: Diagnosis not present

## 2016-09-24 DIAGNOSIS — I129 Hypertensive chronic kidney disease with stage 1 through stage 4 chronic kidney disease, or unspecified chronic kidney disease: Secondary | ICD-10-CM | POA: Diagnosis not present

## 2016-10-07 DIAGNOSIS — E113212 Type 2 diabetes mellitus with mild nonproliferative diabetic retinopathy with macular edema, left eye: Secondary | ICD-10-CM | POA: Diagnosis not present

## 2016-11-18 DIAGNOSIS — E113212 Type 2 diabetes mellitus with mild nonproliferative diabetic retinopathy with macular edema, left eye: Secondary | ICD-10-CM | POA: Diagnosis not present

## 2016-12-17 DIAGNOSIS — N182 Chronic kidney disease, stage 2 (mild): Secondary | ICD-10-CM | POA: Diagnosis not present

## 2016-12-17 DIAGNOSIS — E1122 Type 2 diabetes mellitus with diabetic chronic kidney disease: Secondary | ICD-10-CM | POA: Diagnosis not present

## 2016-12-17 DIAGNOSIS — E11319 Type 2 diabetes mellitus with unspecified diabetic retinopathy without macular edema: Secondary | ICD-10-CM | POA: Diagnosis not present

## 2017-01-08 DIAGNOSIS — E113212 Type 2 diabetes mellitus with mild nonproliferative diabetic retinopathy with macular edema, left eye: Secondary | ICD-10-CM | POA: Diagnosis not present

## 2017-01-19 DIAGNOSIS — H25811 Combined forms of age-related cataract, right eye: Secondary | ICD-10-CM | POA: Diagnosis not present

## 2017-01-19 DIAGNOSIS — H401131 Primary open-angle glaucoma, bilateral, mild stage: Secondary | ICD-10-CM | POA: Diagnosis not present

## 2017-01-19 DIAGNOSIS — E113212 Type 2 diabetes mellitus with mild nonproliferative diabetic retinopathy with macular edema, left eye: Secondary | ICD-10-CM | POA: Diagnosis not present

## 2017-02-19 DIAGNOSIS — E113212 Type 2 diabetes mellitus with mild nonproliferative diabetic retinopathy with macular edema, left eye: Secondary | ICD-10-CM | POA: Diagnosis not present

## 2017-03-17 DIAGNOSIS — E039 Hypothyroidism, unspecified: Secondary | ICD-10-CM | POA: Diagnosis not present

## 2017-03-17 DIAGNOSIS — E1122 Type 2 diabetes mellitus with diabetic chronic kidney disease: Secondary | ICD-10-CM | POA: Diagnosis not present

## 2017-03-17 DIAGNOSIS — E785 Hyperlipidemia, unspecified: Secondary | ICD-10-CM | POA: Diagnosis not present

## 2017-03-17 DIAGNOSIS — Z0000 Encounter for general adult medical examination without abnormal findings: Secondary | ICD-10-CM | POA: Diagnosis not present

## 2017-03-24 DIAGNOSIS — N182 Chronic kidney disease, stage 2 (mild): Secondary | ICD-10-CM | POA: Diagnosis not present

## 2017-03-24 DIAGNOSIS — Z0000 Encounter for general adult medical examination without abnormal findings: Secondary | ICD-10-CM | POA: Diagnosis not present

## 2017-03-24 DIAGNOSIS — E1122 Type 2 diabetes mellitus with diabetic chronic kidney disease: Secondary | ICD-10-CM | POA: Diagnosis not present

## 2017-04-15 DIAGNOSIS — M1711 Unilateral primary osteoarthritis, right knee: Secondary | ICD-10-CM | POA: Diagnosis not present

## 2017-04-15 DIAGNOSIS — M1712 Unilateral primary osteoarthritis, left knee: Secondary | ICD-10-CM | POA: Diagnosis not present

## 2017-04-16 DIAGNOSIS — E113212 Type 2 diabetes mellitus with mild nonproliferative diabetic retinopathy with macular edema, left eye: Secondary | ICD-10-CM | POA: Diagnosis not present

## 2017-06-01 DIAGNOSIS — H401131 Primary open-angle glaucoma, bilateral, mild stage: Secondary | ICD-10-CM | POA: Diagnosis not present

## 2017-06-22 DIAGNOSIS — E039 Hypothyroidism, unspecified: Secondary | ICD-10-CM | POA: Diagnosis not present

## 2017-06-22 DIAGNOSIS — E1122 Type 2 diabetes mellitus with diabetic chronic kidney disease: Secondary | ICD-10-CM | POA: Diagnosis not present

## 2017-06-22 DIAGNOSIS — I129 Hypertensive chronic kidney disease with stage 1 through stage 4 chronic kidney disease, or unspecified chronic kidney disease: Secondary | ICD-10-CM | POA: Diagnosis not present

## 2017-07-01 DIAGNOSIS — E113212 Type 2 diabetes mellitus with mild nonproliferative diabetic retinopathy with macular edema, left eye: Secondary | ICD-10-CM | POA: Diagnosis not present

## 2017-08-06 DIAGNOSIS — H401131 Primary open-angle glaucoma, bilateral, mild stage: Secondary | ICD-10-CM | POA: Diagnosis not present

## 2017-08-06 DIAGNOSIS — E113212 Type 2 diabetes mellitus with mild nonproliferative diabetic retinopathy with macular edema, left eye: Secondary | ICD-10-CM | POA: Diagnosis not present

## 2017-08-06 DIAGNOSIS — H25811 Combined forms of age-related cataract, right eye: Secondary | ICD-10-CM | POA: Diagnosis not present

## 2017-08-26 DIAGNOSIS — E113212 Type 2 diabetes mellitus with mild nonproliferative diabetic retinopathy with macular edema, left eye: Secondary | ICD-10-CM | POA: Diagnosis not present

## 2017-08-26 DIAGNOSIS — H43812 Vitreous degeneration, left eye: Secondary | ICD-10-CM | POA: Diagnosis not present

## 2017-08-31 DIAGNOSIS — K219 Gastro-esophageal reflux disease without esophagitis: Secondary | ICD-10-CM | POA: Diagnosis not present

## 2017-09-17 DIAGNOSIS — E1122 Type 2 diabetes mellitus with diabetic chronic kidney disease: Secondary | ICD-10-CM | POA: Diagnosis not present

## 2017-09-17 DIAGNOSIS — E785 Hyperlipidemia, unspecified: Secondary | ICD-10-CM | POA: Diagnosis not present

## 2017-09-17 DIAGNOSIS — N390 Urinary tract infection, site not specified: Secondary | ICD-10-CM | POA: Diagnosis not present

## 2017-09-17 DIAGNOSIS — I129 Hypertensive chronic kidney disease with stage 1 through stage 4 chronic kidney disease, or unspecified chronic kidney disease: Secondary | ICD-10-CM | POA: Diagnosis not present

## 2017-09-17 DIAGNOSIS — E039 Hypothyroidism, unspecified: Secondary | ICD-10-CM | POA: Diagnosis not present

## 2017-09-24 DIAGNOSIS — E11319 Type 2 diabetes mellitus with unspecified diabetic retinopathy without macular edema: Secondary | ICD-10-CM | POA: Diagnosis not present

## 2017-09-24 DIAGNOSIS — N182 Chronic kidney disease, stage 2 (mild): Secondary | ICD-10-CM | POA: Diagnosis not present

## 2017-09-24 DIAGNOSIS — E1122 Type 2 diabetes mellitus with diabetic chronic kidney disease: Secondary | ICD-10-CM | POA: Diagnosis not present

## 2017-11-04 DIAGNOSIS — E113213 Type 2 diabetes mellitus with mild nonproliferative diabetic retinopathy with macular edema, bilateral: Secondary | ICD-10-CM | POA: Diagnosis not present

## 2017-11-04 DIAGNOSIS — H43812 Vitreous degeneration, left eye: Secondary | ICD-10-CM | POA: Diagnosis not present

## 2017-12-21 DIAGNOSIS — E113213 Type 2 diabetes mellitus with mild nonproliferative diabetic retinopathy with macular edema, bilateral: Secondary | ICD-10-CM | POA: Diagnosis not present

## 2017-12-21 DIAGNOSIS — H25811 Combined forms of age-related cataract, right eye: Secondary | ICD-10-CM | POA: Diagnosis not present

## 2017-12-21 DIAGNOSIS — H401131 Primary open-angle glaucoma, bilateral, mild stage: Secondary | ICD-10-CM | POA: Diagnosis not present

## 2017-12-22 DIAGNOSIS — E1122 Type 2 diabetes mellitus with diabetic chronic kidney disease: Secondary | ICD-10-CM | POA: Diagnosis not present

## 2017-12-22 DIAGNOSIS — E11319 Type 2 diabetes mellitus with unspecified diabetic retinopathy without macular edema: Secondary | ICD-10-CM | POA: Diagnosis not present

## 2017-12-22 DIAGNOSIS — N182 Chronic kidney disease, stage 2 (mild): Secondary | ICD-10-CM | POA: Diagnosis not present

## 2017-12-22 DIAGNOSIS — E1165 Type 2 diabetes mellitus with hyperglycemia: Secondary | ICD-10-CM | POA: Diagnosis not present

## 2018-01-05 DIAGNOSIS — M6530 Trigger finger, unspecified finger: Secondary | ICD-10-CM | POA: Diagnosis not present

## 2018-02-04 DIAGNOSIS — H43812 Vitreous degeneration, left eye: Secondary | ICD-10-CM | POA: Diagnosis not present

## 2018-02-04 DIAGNOSIS — E113213 Type 2 diabetes mellitus with mild nonproliferative diabetic retinopathy with macular edema, bilateral: Secondary | ICD-10-CM | POA: Diagnosis not present

## 2018-03-24 DIAGNOSIS — E785 Hyperlipidemia, unspecified: Secondary | ICD-10-CM | POA: Diagnosis not present

## 2018-03-24 DIAGNOSIS — E1165 Type 2 diabetes mellitus with hyperglycemia: Secondary | ICD-10-CM | POA: Diagnosis not present

## 2018-03-24 DIAGNOSIS — I10 Essential (primary) hypertension: Secondary | ICD-10-CM | POA: Diagnosis not present

## 2018-03-31 DIAGNOSIS — E1122 Type 2 diabetes mellitus with diabetic chronic kidney disease: Secondary | ICD-10-CM | POA: Diagnosis not present

## 2018-03-31 DIAGNOSIS — Z0000 Encounter for general adult medical examination without abnormal findings: Secondary | ICD-10-CM | POA: Diagnosis not present

## 2018-03-31 DIAGNOSIS — E785 Hyperlipidemia, unspecified: Secondary | ICD-10-CM | POA: Diagnosis not present

## 2018-04-01 DIAGNOSIS — E113212 Type 2 diabetes mellitus with mild nonproliferative diabetic retinopathy with macular edema, left eye: Secondary | ICD-10-CM | POA: Diagnosis not present

## 2018-04-19 DIAGNOSIS — L858 Other specified epidermal thickening: Secondary | ICD-10-CM | POA: Diagnosis not present

## 2018-04-19 DIAGNOSIS — M25561 Pain in right knee: Secondary | ICD-10-CM | POA: Diagnosis not present

## 2018-05-21 ENCOUNTER — Ambulatory Visit (HOSPITAL_COMMUNITY)
Admission: EM | Admit: 2018-05-21 | Discharge: 2018-05-21 | Disposition: A | Discharge status: Home / Self Care | Payer: 59 | Attending: Family Medicine | Admitting: Family Medicine

## 2018-05-21 ENCOUNTER — Encounter (HOSPITAL_COMMUNITY): Payer: Self-pay | Admitting: Emergency Medicine

## 2018-05-21 DIAGNOSIS — W260XXA Contact with knife, initial encounter: Secondary | ICD-10-CM

## 2018-05-21 DIAGNOSIS — Z23 Encounter for immunization: Secondary | ICD-10-CM | POA: Diagnosis not present

## 2018-05-21 DIAGNOSIS — S61011A Laceration without foreign body of right thumb without damage to nail, initial encounter: Secondary | ICD-10-CM

## 2018-05-21 MED ORDER — CEPHALEXIN 500 MG PO CAPS: 500.0000 mg | ORAL_CAPSULE | Freq: Two times a day (BID) | ORAL | 0 refills | Status: AC

## 2018-05-21 MED ORDER — TETANUS-DIPHTH-ACELL PERTUSSIS 5-2.5-18.5 LF-MCG/0.5 IM SUSP
0.5000 mL | Freq: Once | INTRAMUSCULAR | Status: AC
  Administered 2018-05-21: 0.5 mL via INTRAMUSCULAR

## 2018-05-21 MED ORDER — TETANUS-DIPHTH-ACELL PERTUSSIS 5-2.5-18.5 LF-MCG/0.5 IM SUSP
INTRAMUSCULAR | Status: AC
  Filled 2018-05-21: qty 0.5

## 2018-05-21 MED ORDER — HYDROCODONE-ACETAMINOPHEN 5-325 MG PO TABS: 1.0000 | ORAL_TABLET | Freq: Four times a day (QID) | ORAL | 0 refills | Status: AC | PRN

## 2018-05-21 NOTE — ED Provider Notes (Signed)
[facility]    CSN: [id number] Arrival date & time: 05/21/18  1957     History   Chief Complaint Chief Complaint  Patient presents with  . Laceration    HPI Melinda Holden is a 62 y.o. female.   62 year old diabetic woman with bilateral knee replacements who comes in having lacerated her right thumb with a kitchen mandolin.  She  avulsed the radial aspect of right thumb nail edge   Uncertain last technical shot     Past Medical History:  Diagnosis Date  . Arthritis   . Diabetes mellitus without complication (HCC)   . GERD (gastroesophageal reflux disease)   . Hypertension   . Hypothyroidism   . Shortness of breath    exertion    Patient Active Problem List   Diagnosis Date Noted  . Primary osteoarthritis of left knee 06/20/2014  . 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 Morbid obesity (HCC) 02/28/2014  . Diabetes (HCC) 02/28/2014    Past Surgical History:  Procedure Laterality Date  . CESAREAN SECTION  1995  . TOTAL KNEE ARTHROPLASTY Right 02/28/2014   Procedure: TOTAL KNEE ARTHROPLASTY;  /w spinal  Surgeon: Velna Ochs, MD;  Location: [facility] OR;  Service: Orthopedics;  Laterality: Right;  . TOTAL KNEE ARTHROPLASTY Left 06/20/2014   Procedure: LEFT TOTAL KNEE ARTHROPLASTY;  Surgeon: Velna Ochs, MD;  Location: [facility] OR;  Service: Orthopedics;  Laterality: Left;  . WISDOM TOOTH EXTRACTION      OB History   None      Home Medications    Prior to Admission medications   Medication Sig Start Date End Date Taking? Authorizing Provider  aspirin 325 MG EC tablet Take 1 tablet (325 mg total) by mouth 2 (two) times daily after a meal. 06/22/14   Elodia Florence, PA-C  atorvastatin (LIPITOR) 80 MG tablet Take 80 mg by mouth at bedtime.     [provider]  cephALEXin (KEFLEX) 500 MG capsule Take 1 capsule (500 mg total) by mouth 2 (two) times daily for 5 days. 05/21/18 05/26/18  Elvina Sidle, MD  famotidine (PEPCID) 20 MG tablet Take 20 mg by  mouth at bedtime.     [provider]  HYDROcodone-acetaminophen (NORCO) 5-325 MG tablet Take 1 tablet by mouth every 6 (six) hours as needed for moderate pain. 05/21/18   Elvina Sidle, MD  insulin detemir (LEVEMIR) 100 UNIT/ML injection Inject 75 Units into the skin 2 (two) times daily.    [provider]  levothyroxine (SYNTHROID, LEVOTHROID) 75 MCG tablet Take 75 mcg by mouth daily before breakfast.    [provider]  lisinopril (PRINIVIL,ZESTRIL) 20 MG tablet Take 20 mg by mouth at bedtime.     [provider]  metFORMIN (GLUCOPHAGE-XR) 500 MG 24 hr tablet Take 1,000 mg by mouth 2 (two) times daily.    [provider]  methocarbamol (ROBAXIN) 500 MG tablet Take 1 tablet (500 mg total) by mouth every 6 (six) hours as needed for muscle spasms. 06/22/14   Elodia Florence, PA-C  methocarbamol 500 mg in dextrose 5 % 50 mL Inject 500 mg into the vein every 6 (six) hours as needed. 06/22/14   Elodia Florence, PA-C  Timolol Maleate (ISTALOL) 0.5 % (DAILY) SOLN Place 1 drop into both eyes daily.    [provider]    Family History History reviewed. No pertinent family history.  Social History Social History   Tobacco Use  . Smoking status: Never Smoker  . Smokeless  tobacco: Never Used  Substance Use Topics  . Alcohol use: No  . Drug use: No     Allergies   Thimerosal; Benzalkonium; Chloramphenicols; and Penicillins   Review of Systems Review of Systems   Physical Exam Triage Vital Signs ED Triage Vitals [05/21/18 2004]  Enc Vitals Group     BP (!) 156/83     Pulse Rate (!) 108     Resp 18     Temp 97.7 F (36.5 C)     Temp Source Oral     SpO2 98 %     Weight      Height      Head Circumference      Peak Flow      Pain Score 6     Pain Loc      Pain Edu?      Excl. in GC?    No data found.  Updated Vital Signs BP (!) 156/83 (BP Location: Right Arm)   Pulse (!) 108   Temp 97.7 F (36.5 C) (Oral)   Resp 18    SpO2 98%    Physical Exam  Constitutional: She appears well-developed and well-nourished.  Eyes: Conjunctivae are normal.  Neck: Normal range of motion. Neck supple.  Pulmonary/Chest: Effort normal.  Skin:  Patient has a laceration on the radial side of her right thumb with a avulsion of the skin from the mandolin kitchen appliance she was using  Psychiatric: She has a normal mood and affect.  Nursing note and vitals reviewed.    UC Treatments / Results  Labs (all labs ordered are listed, but only abnormal results are displayed) Labs Reviewed - No data to display  EKG None  Radiology No results found.  Procedures Laceration Repair Date/Time: 05/21/2018 8:24 PM Performed by: Elvina Sidle, MD Authorized by: Elvina Sidle, MD   Consent:    Consent obtained:  Verbal   Consent given by:  Patient   Risks discussed:  Infection and pain   Alternatives discussed:  No treatment Anesthesia (see MAR for exact dosages):    Anesthesia method:  None Laceration details:    Location:  Finger   Finger location:  R thumb   Length (cm):  0.8   Depth (mm):  3 Exploration:    Hemostasis achieved with:  Direct pressure   Contaminated: no   Treatment:    Area cleansed with:  Soap and water   Amount of cleaning:  Standard   Visualized foreign bodies/material removed: no   Skin repair:    Repair method:  Tissue adhesive Approximation:    Approximation:  Loose Post-procedure details:    Dressing:  Bulky dressing   Patient tolerance of procedure:  Tolerated well, no immediate complications   (including critical care time)  Medications Ordered in UC Medications  Tdap (BOOSTRIX) injection 0.5 mL (0.5 mLs Intramuscular Given 05/21/18 2026)    Initial Impression / Assessment and Plan / UC Course  I have reviewed the triage vital signs and the nursing notes.  Pertinent labs & imaging results that were available during my care of the patient were reviewed by me and  considered in my medical decision making (see chart for details).    Final Clinical Impressions(s) / UC Diagnoses   Final diagnoses:  Laceration of right thumb without foreign body without damage to nail, initial encounter     Discharge Instructions     Remember that the glue gets dissolved by either nail polish remover or anything with petroleum  jelly.    ED Prescriptions    Medication Sig Dispense Auth. Provider   cephALEXin (KEFLEX) 500 MG capsule Take 1 capsule (500 mg total) by mouth 2 (two) times daily for 5 days. 10 capsule Elvina Sidle, MD   HYDROcodone-acetaminophen (NORCO) 5-325 MG tablet Take 1 tablet by mouth every 6 (six) hours as needed for moderate pain. 12 tablet Elvina Sidle, MD     Controlled Substance Prescriptions [place] Controlled Substance Registry consulted? Not Applicable   Elvina Sidle, MD 05/21/18 2029

## 2018-05-21 NOTE — ED Triage Notes (Signed)
Pt here for laceration to left thumb; pt sts unable to get bleeding to stop without pressure at present

## 2018-05-21 NOTE — Discharge Instructions (Addendum)
Remember that the glue gets dissolved by either nail polish remover or anything with petroleum jelly.  It is okay to bathe starting tomorrow morning.  You may want to put a bulky dressing over the thumb for a day or 2 while its healing so that it does not hurt too much when you bump it.

## 2018-06-01 DIAGNOSIS — E113212 Type 2 diabetes mellitus with mild nonproliferative diabetic retinopathy with macular edema, left eye: Secondary | ICD-10-CM | POA: Diagnosis not present

## 2018-06-01 DIAGNOSIS — H43812 Vitreous degeneration, left eye: Secondary | ICD-10-CM | POA: Diagnosis not present

## 2018-06-11 DIAGNOSIS — H401131 Primary open-angle glaucoma, bilateral, mild stage: Secondary | ICD-10-CM | POA: Diagnosis not present

## 2018-06-11 DIAGNOSIS — H26492 Other secondary cataract, left eye: Secondary | ICD-10-CM | POA: Diagnosis not present

## 2018-06-11 DIAGNOSIS — H25811 Combined forms of age-related cataract, right eye: Secondary | ICD-10-CM | POA: Diagnosis not present

## 2018-06-16 DIAGNOSIS — M65311 Trigger thumb, right thumb: Secondary | ICD-10-CM | POA: Diagnosis not present

## 2018-06-17 DIAGNOSIS — I129 Hypertensive chronic kidney disease with stage 1 through stage 4 chronic kidney disease, or unspecified chronic kidney disease: Secondary | ICD-10-CM | POA: Diagnosis not present

## 2018-06-17 DIAGNOSIS — N182 Chronic kidney disease, stage 2 (mild): Secondary | ICD-10-CM | POA: Diagnosis not present

## 2018-06-17 DIAGNOSIS — E785 Hyperlipidemia, unspecified: Secondary | ICD-10-CM | POA: Diagnosis not present

## 2018-06-17 DIAGNOSIS — E1165 Type 2 diabetes mellitus with hyperglycemia: Secondary | ICD-10-CM | POA: Diagnosis not present

## 2018-06-18 DIAGNOSIS — E1122 Type 2 diabetes mellitus with diabetic chronic kidney disease: Secondary | ICD-10-CM | POA: Diagnosis not present

## 2018-06-24 DIAGNOSIS — E1122 Type 2 diabetes mellitus with diabetic chronic kidney disease: Secondary | ICD-10-CM | POA: Diagnosis not present

## 2018-06-24 DIAGNOSIS — E785 Hyperlipidemia, unspecified: Secondary | ICD-10-CM | POA: Diagnosis not present

## 2018-06-24 DIAGNOSIS — I129 Hypertensive chronic kidney disease with stage 1 through stage 4 chronic kidney disease, or unspecified chronic kidney disease: Secondary | ICD-10-CM | POA: Diagnosis not present

## 2018-07-06 DIAGNOSIS — M2550 Pain in unspecified joint: Secondary | ICD-10-CM | POA: Diagnosis not present

## 2018-07-06 DIAGNOSIS — M7989 Other specified soft tissue disorders: Secondary | ICD-10-CM | POA: Diagnosis not present

## 2018-08-16 DIAGNOSIS — K219 Gastro-esophageal reflux disease without esophagitis: Secondary | ICD-10-CM | POA: Diagnosis not present

## 2019-10-09 ENCOUNTER — Ambulatory Visit: Payer: 59 | Attending: Internal Medicine

## 2019-10-09 DIAGNOSIS — Z23 Encounter for immunization: Secondary | ICD-10-CM

## 2019-10-09 NOTE — Progress Notes (Signed)
   Covid-19 Vaccination Clinic  Name:  Melinda Holden    MRN: [id number] DOB: Dec 20, 1955  10/09/2019  [name] was observed post Covid-19 immunization for 15 minutes without incidence. She was provided with Vaccine Information Sheet and instruction to access the V-Safe system.   [name] was instructed to call 911 with any severe reactions post vaccine: Marland Kitchen Difficulty breathing  . Swelling of your face and throat  . A fast heartbeat  . A bad rash all over your body  . Dizziness and weakness    Immunizations Administered    Name Date Dose VIS Date Route   Pfizer COVID-19 Vaccine 10/09/2019  8:45 AM 0.3 mL 07/22/2019 Intramuscular   Manufacturer: [employer], [employer]   Lot: [id number]   NDC: [address]-5

## 2019-11-02 ENCOUNTER — Ambulatory Visit: Payer: 59 | Attending: Internal Medicine

## 2019-11-02 DIAGNOSIS — Z23 Encounter for immunization: Secondary | ICD-10-CM

## 2019-11-02 NOTE — Progress Notes (Signed)
   Covid-19 Vaccination Clinic  Name:  Melinda Holden    MRN: [id number] DOB: 09-09-1955  11/02/2019  [name] was observed post Covid-19 immunization for 15 minutes without incident. She was provided with Vaccine Information Sheet and instruction to access the V-Safe system.   [name] was instructed to call 911 with any severe reactions post vaccine: Marland Kitchen Difficulty breathing  . Swelling of face and throat  . A fast heartbeat  . A bad rash all over body  . Dizziness and weakness   Immunizations Administered    Name Date Dose VIS Date Route   Pfizer COVID-19 Vaccine 11/02/2019 10:00 AM 0.3 mL 07/22/2019 Intramuscular   Manufacturer: [employer], [employer]   Lot: [id number]   NDC: [address]-9

## 2019-12-07 ENCOUNTER — Other Ambulatory Visit: Payer: Self-pay | Admitting: Family Medicine

## 2019-12-07 DIAGNOSIS — Z1231 Encounter for screening mammogram for malignant neoplasm of breast: Secondary | ICD-10-CM

## 2020-01-12 ENCOUNTER — Ambulatory Visit: Payer: 59

## 2020-01-17 ENCOUNTER — Other Ambulatory Visit: Payer: Self-pay

## 2020-01-17 ENCOUNTER — Ambulatory Visit
Admission: RE | Admit: 2020-01-17 | Discharge: 2020-01-17 | Disposition: A | Discharge status: Home / Self Care | Payer: Medicare Other | Source: Ambulatory Visit | Attending: Family Medicine | Admitting: Family Medicine

## 2020-01-17 DIAGNOSIS — Z1231 Encounter for screening mammogram for malignant neoplasm of breast: Secondary | ICD-10-CM

## 2020-11-17 IMAGING — MG DIGITAL SCREENING BILAT W/ TOMO W/ CAD
8 of 14 series · 8 of 40 positions shown · non-contrast
Comparison: None.

ACR Breast Density Category a: The breast tissue is almost entirely
fatty.

CLINICAL DATA: Screening.

EXAM:
DIGITAL SCREENING BILATERAL MAMMOGRAM WITH TOMO AND CAD

[L MLO synth-2D (1 of 2)]
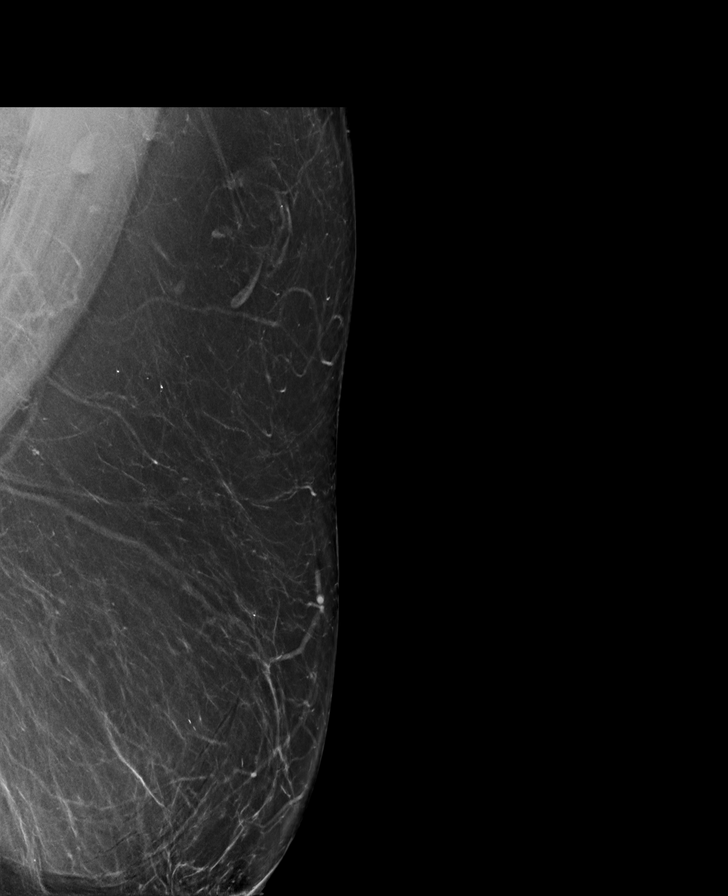

[R MLO synth-2D (1 of 2)]
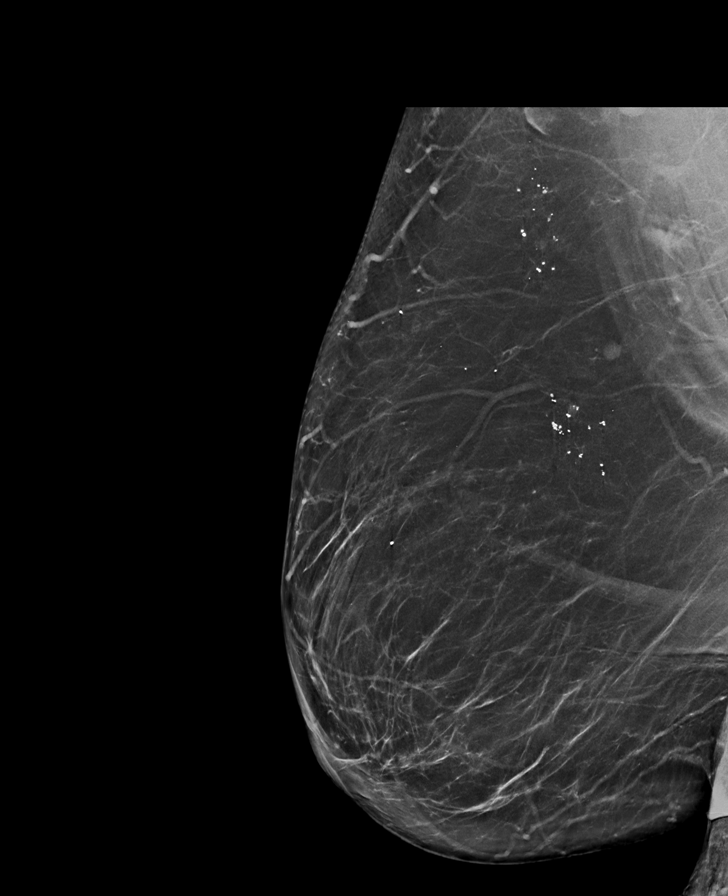

[L CC synth-2D]
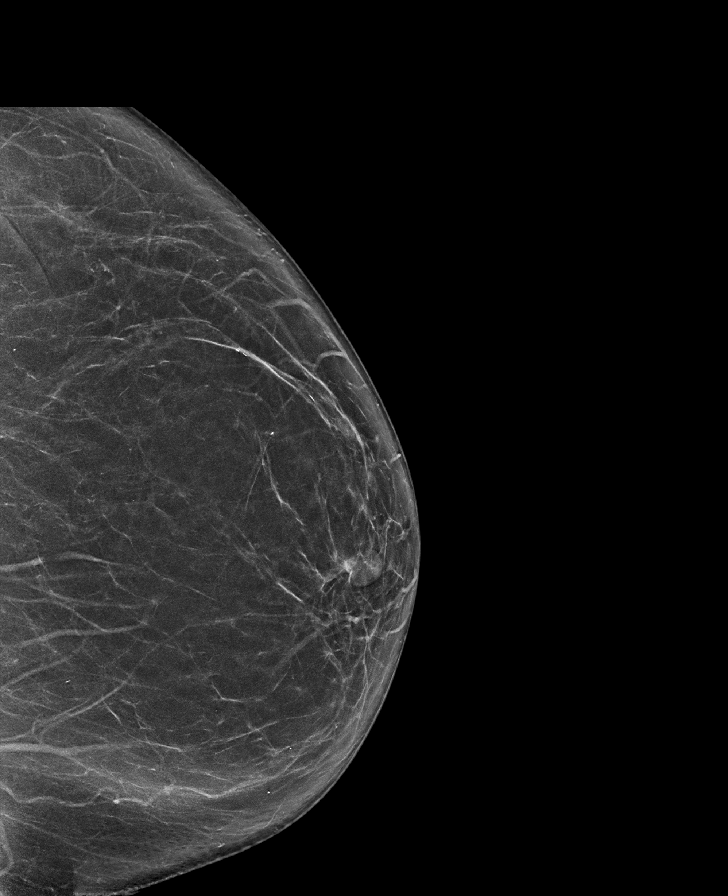

[L CV synth-2D]
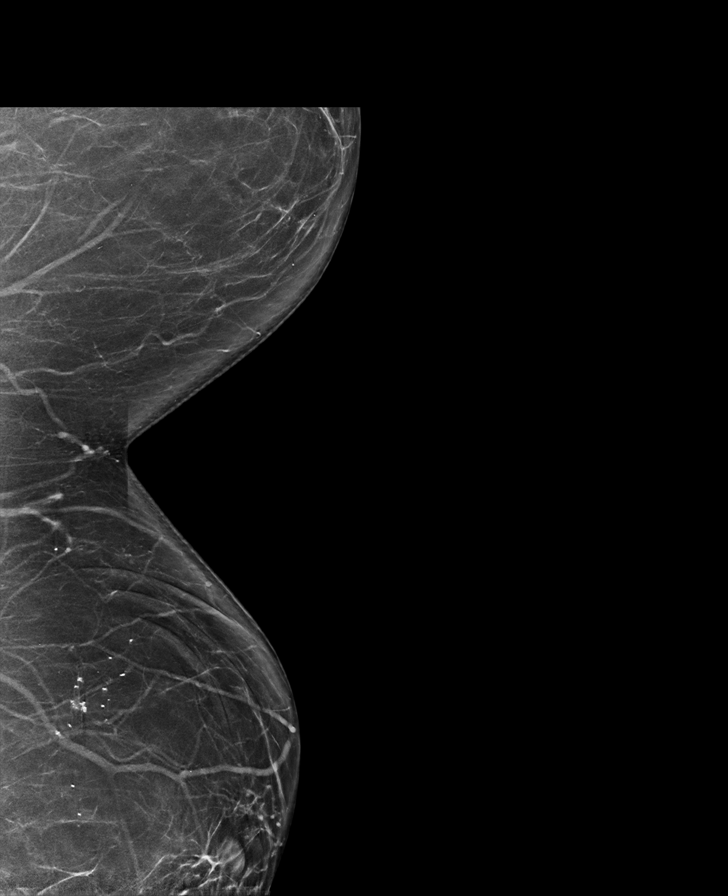

[R CC synth-2D]
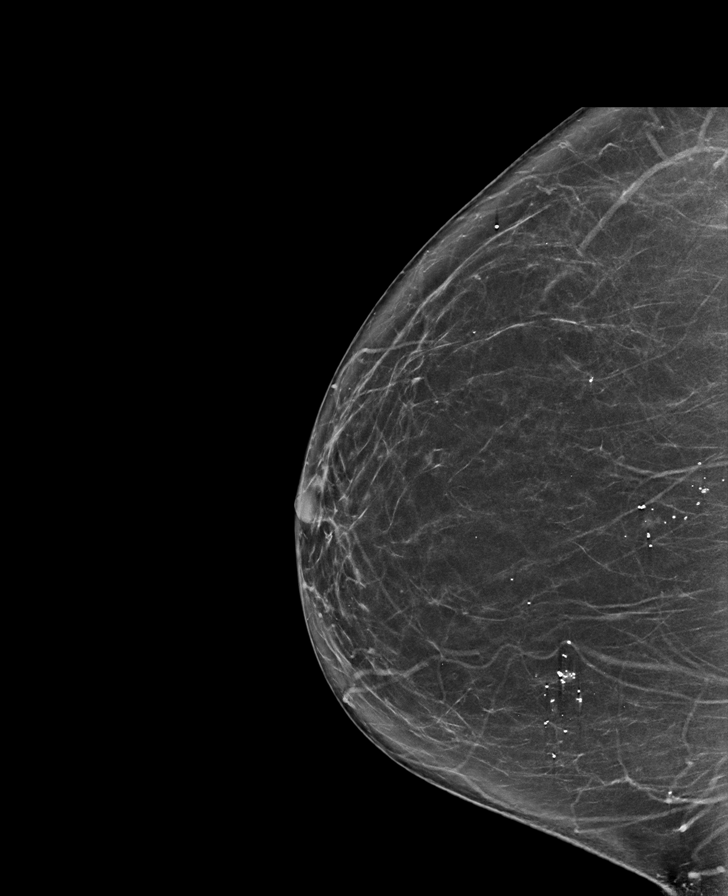

[L MLO synth-2D (2 of 2)]
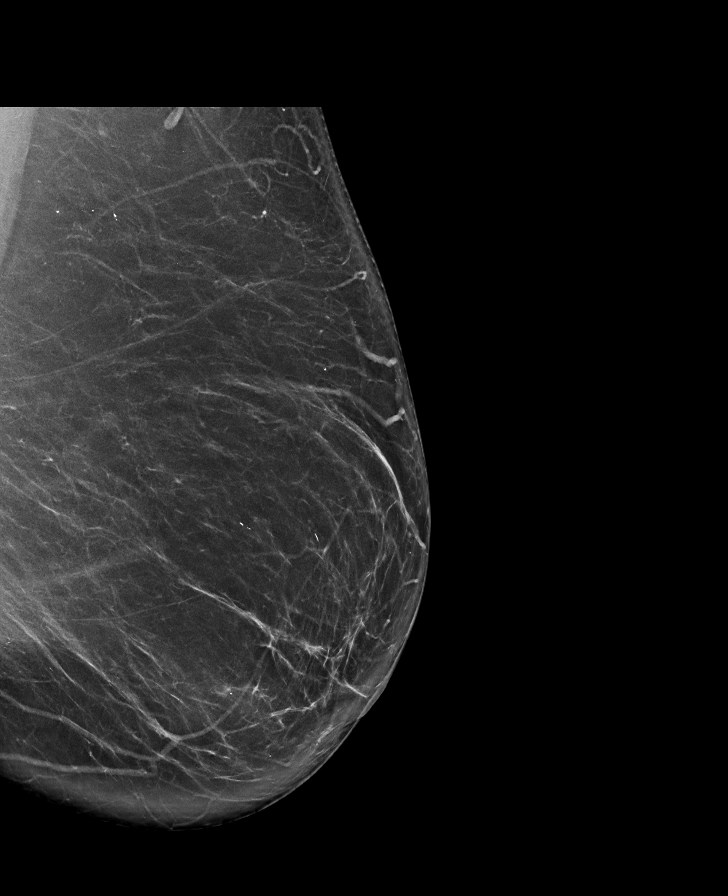

[R MLO synth-2D (2 of 2)]
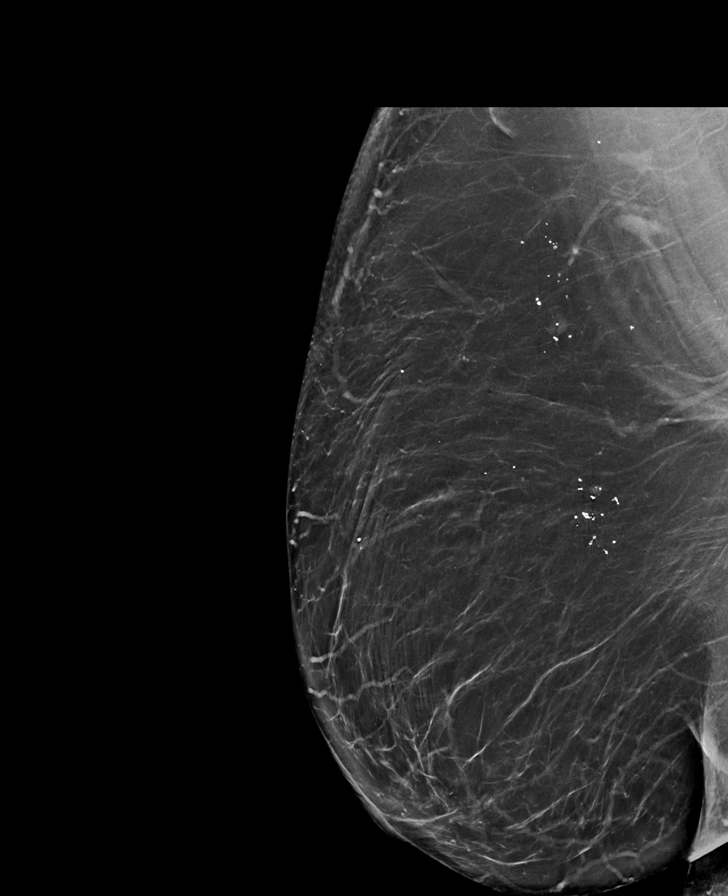

[L MLO tomo · tomo slice 49/98.0]
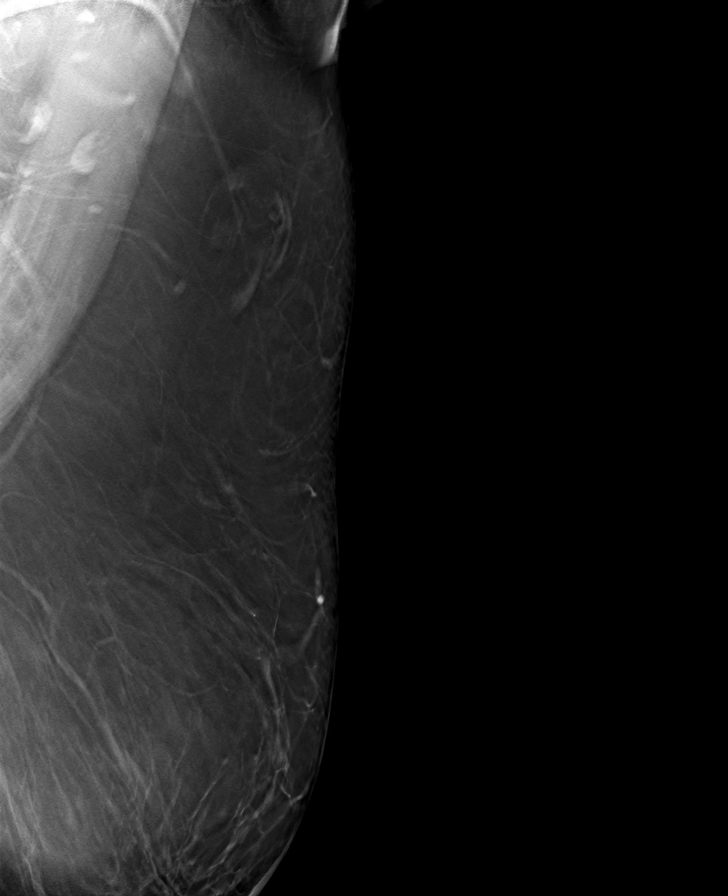

[8 of 40 positions shown; findings below may reference images not displayed]

FINDINGS: There are no findings suspicious for malignancy. Images were
processed with CAD.
IMPRESSION: No mammographic evidence of malignancy. A result letter of this
screening mammogram will be mailed directly to the patient.

RECOMMENDATION:
Screening mammogram in one year. (Code:[id number])

BI-RADS CATEGORY  1: Negative.

## 2023-10-13 ENCOUNTER — Other Ambulatory Visit (HOSPITAL_COMMUNITY): Payer: Self-pay | Admitting: Endocrinology

## 2023-10-13 DIAGNOSIS — E210 Primary hyperparathyroidism: Secondary | ICD-10-CM

## 2023-10-19 ENCOUNTER — Encounter (HOSPITAL_COMMUNITY)
Admission: RE | Admit: 2023-10-19 | Discharge: 2023-10-19 | Disposition: A | Discharge status: Home / Self Care | Source: Ambulatory Visit | Attending: Endocrinology | Admitting: Endocrinology

## 2023-10-19 DIAGNOSIS — E210 Primary hyperparathyroidism: Secondary | ICD-10-CM | POA: Diagnosis present

## 2023-10-19 MED ORDER — TECHNETIUM TC 99M SESTAMIBI GENERIC - CARDIOLITE
25.2000 | Freq: Once | INTRAVENOUS | Status: AC | PRN
  Administered 2023-10-19: 25.2 via INTRAVENOUS
